# Patient Record
Sex: Female | Born: 1950 | Race: Black or African American | Hispanic: No | Smoking: Current every day smoker
Health system: Southern US, Community
[De-identification: ages and names within clinical notes are randomized; demographics above are authoritative.]

## PROBLEM LIST (undated history)

## (undated) DIAGNOSIS — B009 Herpesviral infection, unspecified: Secondary | ICD-10-CM

## (undated) DIAGNOSIS — Z853 Personal history of malignant neoplasm of breast: Secondary | ICD-10-CM

## (undated) DIAGNOSIS — F1010 Alcohol abuse, uncomplicated: Secondary | ICD-10-CM

## (undated) DIAGNOSIS — I10 Essential (primary) hypertension: Secondary | ICD-10-CM

## (undated) DIAGNOSIS — B181 Chronic viral hepatitis B without delta-agent: Secondary | ICD-10-CM

## (undated) DIAGNOSIS — B1920 Unspecified viral hepatitis C without hepatic coma: Secondary | ICD-10-CM

## (undated) DIAGNOSIS — K8689 Other specified diseases of pancreas: Secondary | ICD-10-CM

## (undated) DIAGNOSIS — C50919 Malignant neoplasm of unspecified site of unspecified female breast: Secondary | ICD-10-CM

## (undated) DIAGNOSIS — D696 Thrombocytopenia, unspecified: Secondary | ICD-10-CM

## (undated) DIAGNOSIS — K219 Gastro-esophageal reflux disease without esophagitis: Secondary | ICD-10-CM

## (undated) DIAGNOSIS — G4700 Insomnia, unspecified: Secondary | ICD-10-CM

## (undated) DIAGNOSIS — R202 Paresthesia of skin: Secondary | ICD-10-CM

## (undated) DIAGNOSIS — K769 Liver disease, unspecified: Secondary | ICD-10-CM

## (undated) HISTORY — DX: Malignant neoplasm of unspecified site of unspecified female breast: C50.919

## (undated) HISTORY — PX: NEPHRECTOMY: SHX65

## (undated) HISTORY — DX: Unspecified viral hepatitis C without hepatic coma: B19.20

## (undated) HISTORY — DX: Gastro-esophageal reflux disease without esophagitis: K21.9

## (undated) HISTORY — DX: Herpesviral infection, unspecified: B00.9

## (undated) HISTORY — DX: Insomnia, unspecified: G47.00

## (undated) HISTORY — DX: Essential (primary) hypertension: I10

## (undated) HISTORY — DX: Personal history of malignant neoplasm of breast: Z85.3

## (undated) HISTORY — DX: Paresthesia of skin: R20.2

## (undated) HISTORY — PX: ABDOMINAL HYSTERECTOMY: SHX81

## (undated) HISTORY — PX: MASTECTOMY: SHX3

## (undated) HISTORY — DX: Other specified diseases of pancreas: K86.89

## (undated) HISTORY — PX: TUBAL LIGATION: SHX77

## (undated) HISTORY — DX: Alcohol abuse, uncomplicated: F10.10

## (undated) HISTORY — DX: Chronic viral hepatitis B without delta-agent: B18.1

## (undated) HISTORY — DX: Liver disease, unspecified: K76.9

## (undated) HISTORY — DX: Thrombocytopenia, unspecified: D69.6

---

## 2007-11-05 ENCOUNTER — Inpatient Hospital Stay: Payer: Self-pay | Admitting: Internal Medicine

## 2008-05-07 ENCOUNTER — Emergency Department: Payer: Self-pay | Admitting: Emergency Medicine

## 2008-06-19 ENCOUNTER — Inpatient Hospital Stay: Payer: Self-pay | Admitting: Psychiatry

## 2008-07-17 ENCOUNTER — Inpatient Hospital Stay: Payer: Self-pay | Admitting: Internal Medicine

## 2009-06-26 ENCOUNTER — Encounter: Payer: Self-pay | Admitting: Cardiovascular Disease

## 2009-06-26 ENCOUNTER — Emergency Department: Payer: Self-pay | Admitting: Emergency Medicine

## 2009-07-08 ENCOUNTER — Emergency Department: Payer: Self-pay | Admitting: Unknown Physician Specialty

## 2009-11-11 ENCOUNTER — Ambulatory Visit: Payer: Self-pay | Admitting: Family Medicine

## 2010-03-26 ENCOUNTER — Ambulatory Visit: Payer: Self-pay | Admitting: Internal Medicine

## 2010-03-26 ENCOUNTER — Ambulatory Visit: Payer: Self-pay | Admitting: Family Medicine

## 2010-03-27 ENCOUNTER — Ambulatory Visit: Payer: Self-pay | Admitting: Internal Medicine

## 2010-04-09 ENCOUNTER — Encounter: Admission: RE | Admit: 2010-04-09 | Discharge: 2010-04-09 | Payer: Self-pay | Admitting: General Surgery

## 2010-04-21 ENCOUNTER — Ambulatory Visit: Payer: Self-pay | Admitting: General Surgery

## 2010-04-23 ENCOUNTER — Encounter: Admission: RE | Admit: 2010-04-23 | Discharge: 2010-04-23 | Payer: Self-pay | Admitting: General Surgery

## 2010-04-25 ENCOUNTER — Ambulatory Visit: Payer: Self-pay | Admitting: Internal Medicine

## 2010-05-04 IMAGING — CT CT HEAD WITHOUT CONTRAST
2 series · 16 of 30 positions shown, 20 images · non-contrast
Comparison: none

REASON FOR EXAM: falling alot/etoh abuse
COMMENTS:

PROCEDURE:     CT  - CT HEAD WITHOUT CONTRAST  - June 18, 2008  [DATE]
RESULT:     Comparison: No comparison
TECHNIQUE: Multiple axial images from the foramen magnum to the vertex were
obtained without IV contrast.

[Series 4: without · axial · non-contrast · 0.42mm/px · z∈[-123,-3]mm · 13 of 30 slices shown, 17 images]
[im 3/30  brain]
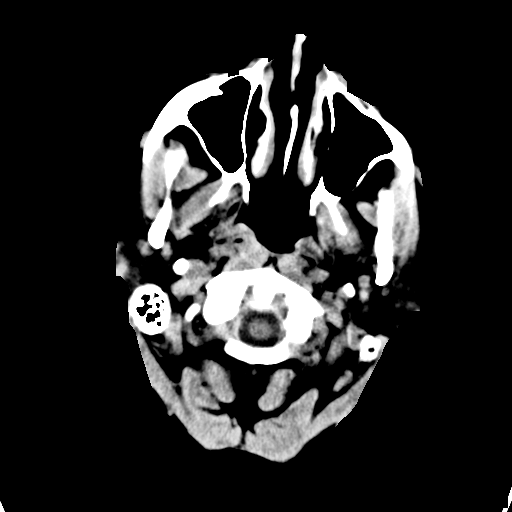
[im 3/30  bone]
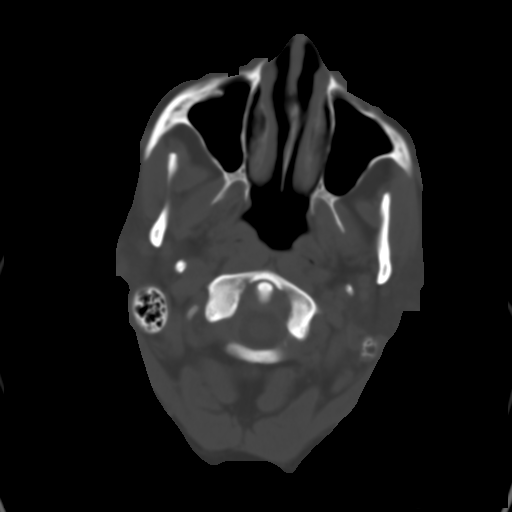
[im 5/30  brain]
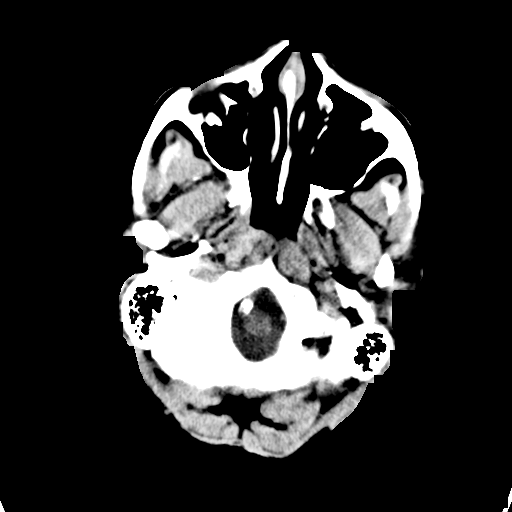
[im 7/30  brain]
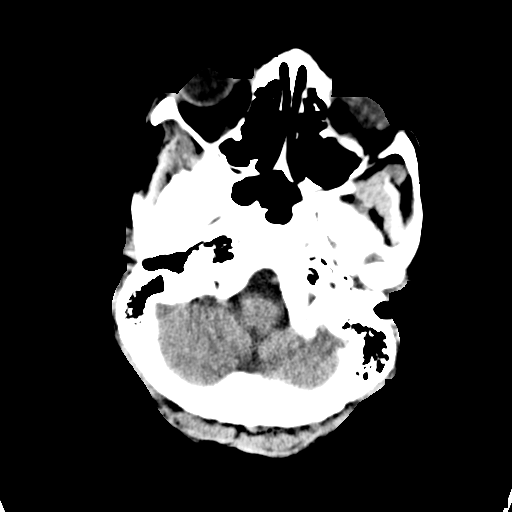
[im 9/30  brain]
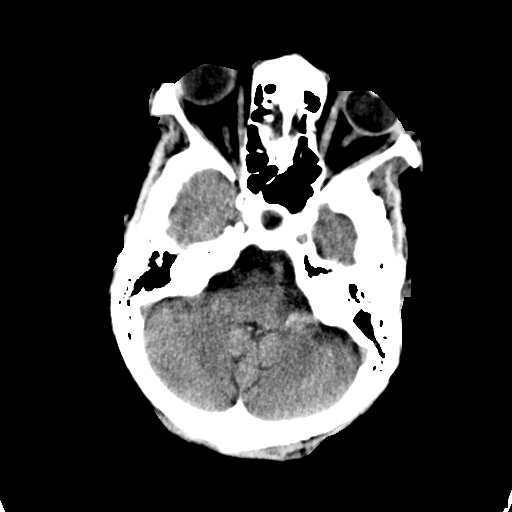
[im 11/30  brain]
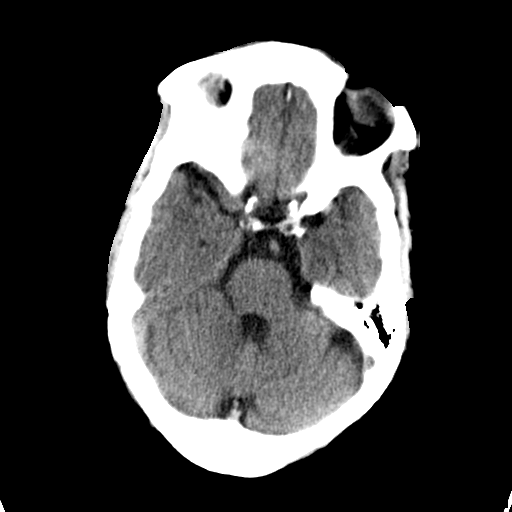
[im 11/30  bone]
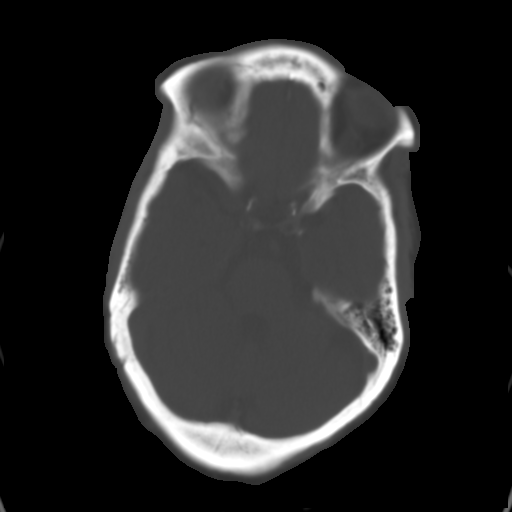
[im 13/30  brain]
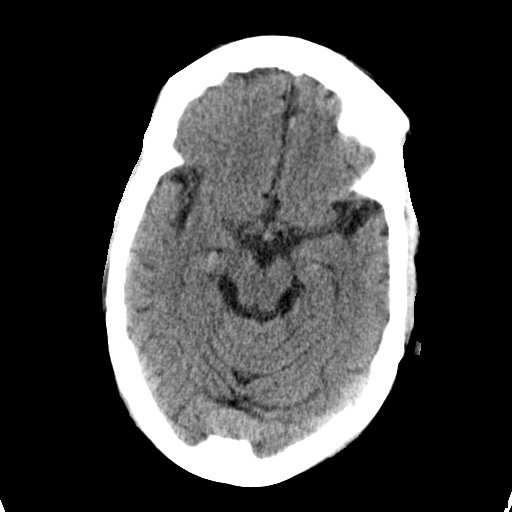
[im 15/30  brain]
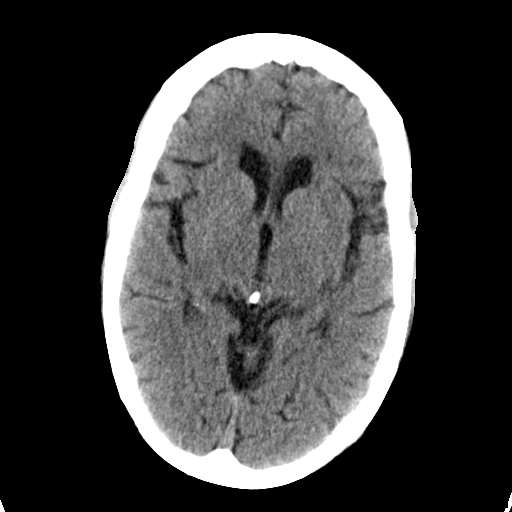
[im 17/30  brain]
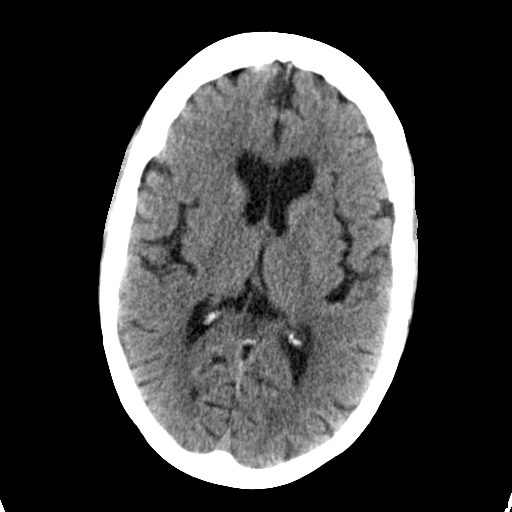
[im 19/30  brain]
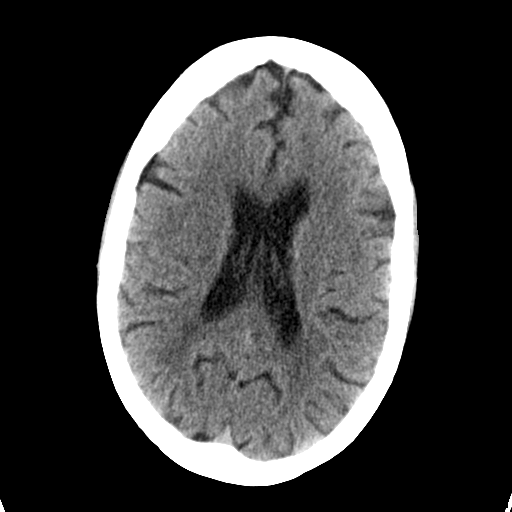
[im 19/30  bone]
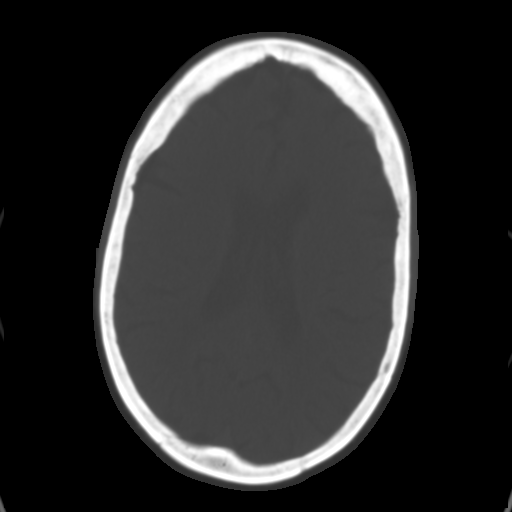
[im 21/30  brain]
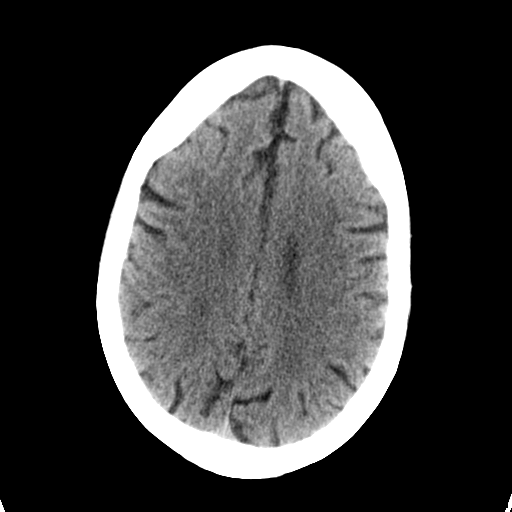
[im 23/30  brain]
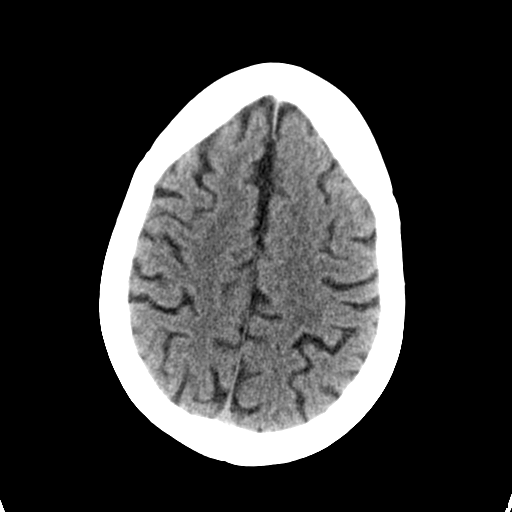
[im 25/30  brain]
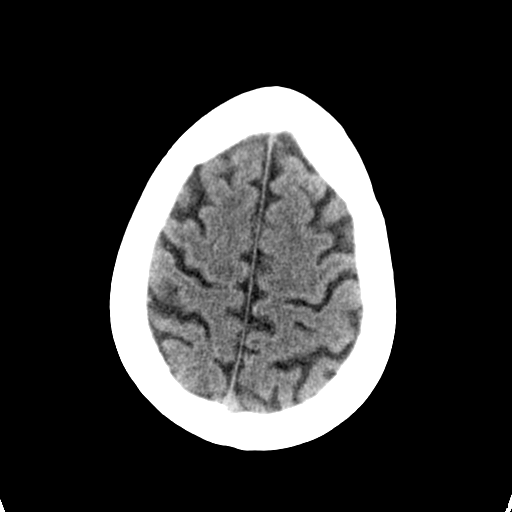
[im 27/30  brain]
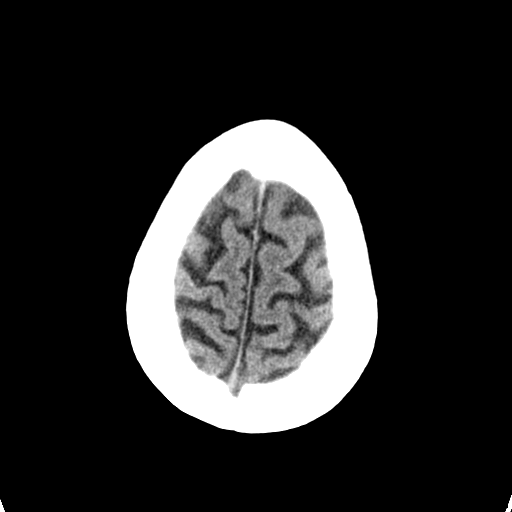
[im 27/30  bone]
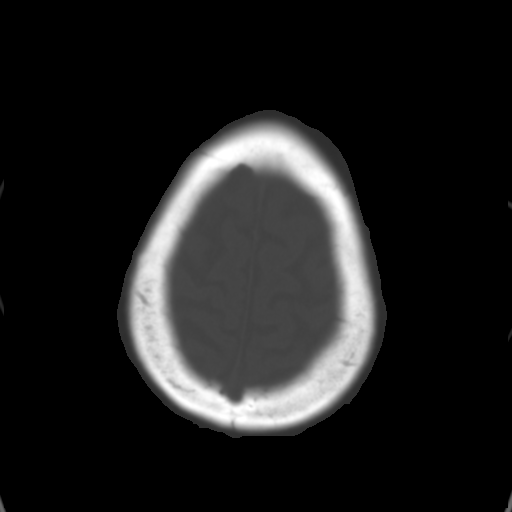

[Series 5: bone · axial · 0.42mm/px · z∈[-123,-83]mm · 3 of 30 slices shown]
[im 3/30  bone]
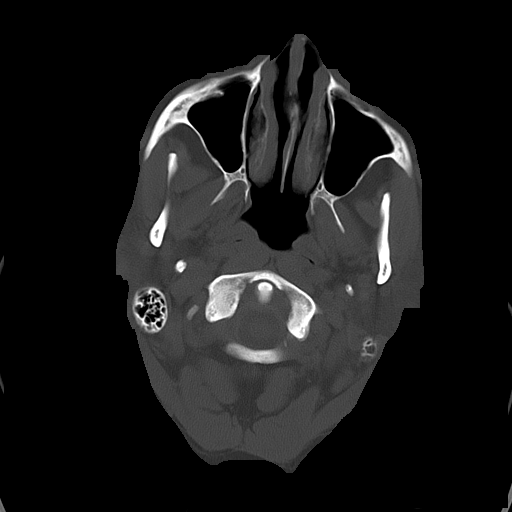
[im 7/30  bone]
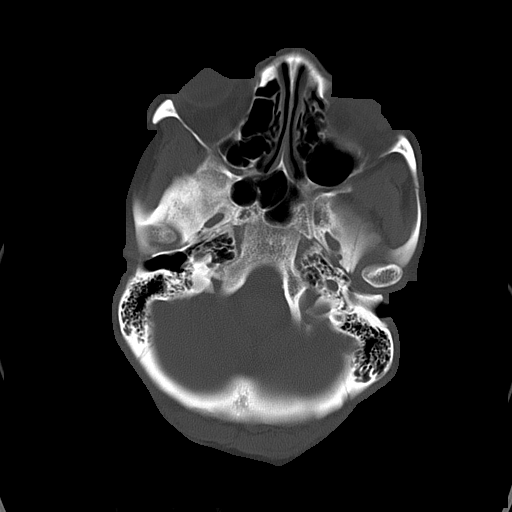
[im 11/30  bone]
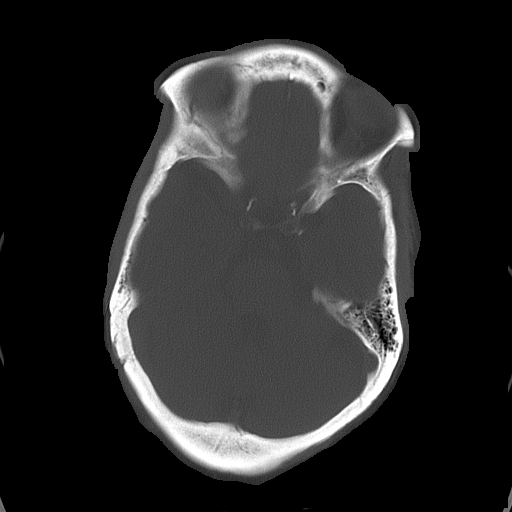

[16 of 30 positions shown; findings below may reference images not displayed]

FINDINGS: There is no evidence for mass effect, midline shift, or extra-axial fluid
collections.  There is no evidence for space-occupying lesion or
intracranial hemorrhage. There is no evidence for cortical-based area of
infarction.

Ventricles and sulci are appropriate for the patient's age. The basal
cisterns are patent.

Visualized portions of the orbits are unremarkable. The paranasal sinuses
and mastoid air cells are unremarkable.

The osseous structures are unremarkable.
IMPRESSION: No acute intracranial process.

## 2010-05-05 ENCOUNTER — Inpatient Hospital Stay: Payer: Self-pay | Admitting: General Surgery

## 2010-05-26 ENCOUNTER — Ambulatory Visit: Payer: Self-pay | Admitting: Internal Medicine

## 2010-06-09 ENCOUNTER — Ambulatory Visit: Payer: Self-pay | Admitting: Internal Medicine

## 2010-06-26 ENCOUNTER — Ambulatory Visit: Payer: Self-pay | Admitting: Internal Medicine

## 2010-07-17 ENCOUNTER — Ambulatory Visit: Payer: Self-pay | Admitting: General Surgery

## 2010-07-26 ENCOUNTER — Ambulatory Visit: Payer: Self-pay | Admitting: Internal Medicine

## 2010-08-22 ENCOUNTER — Inpatient Hospital Stay: Payer: Self-pay | Admitting: Oncology

## 2010-08-26 ENCOUNTER — Ambulatory Visit: Payer: Self-pay | Admitting: Internal Medicine

## 2010-09-25 ENCOUNTER — Ambulatory Visit: Payer: Self-pay | Admitting: Internal Medicine

## 2010-09-30 ENCOUNTER — Ambulatory Visit: Payer: Self-pay | Admitting: General Surgery

## 2010-10-03 ENCOUNTER — Ambulatory Visit: Payer: Self-pay | Admitting: General Surgery

## 2010-10-26 ENCOUNTER — Ambulatory Visit: Payer: Self-pay | Admitting: Internal Medicine

## 2010-11-26 ENCOUNTER — Ambulatory Visit: Payer: Self-pay | Admitting: Internal Medicine

## 2010-12-25 ENCOUNTER — Ambulatory Visit: Payer: Self-pay | Admitting: Internal Medicine

## 2011-01-25 ENCOUNTER — Ambulatory Visit: Payer: Self-pay | Admitting: Internal Medicine

## 2011-02-24 ENCOUNTER — Ambulatory Visit: Payer: Self-pay | Admitting: Internal Medicine

## 2011-03-19 ENCOUNTER — Encounter: Payer: Self-pay | Admitting: Cardiovascular Disease

## 2011-03-27 ENCOUNTER — Ambulatory Visit: Payer: Self-pay | Admitting: Internal Medicine

## 2011-03-30 ENCOUNTER — Encounter: Payer: Self-pay | Admitting: Cardiovascular Disease

## 2011-03-30 ENCOUNTER — Ambulatory Visit (INDEPENDENT_AMBULATORY_CARE_PROVIDER_SITE_OTHER): Payer: Medicaid Other | Admitting: Cardiovascular Disease

## 2011-03-30 VITALS — BP 126/72 | HR 61 | Ht 64.0 in | Wt 186.8 lb

## 2011-03-30 DIAGNOSIS — R9431 Abnormal electrocardiogram [ECG] [EKG]: Secondary | ICD-10-CM

## 2011-03-30 DIAGNOSIS — K759 Inflammatory liver disease, unspecified: Secondary | ICD-10-CM | POA: Insufficient documentation

## 2011-03-30 DIAGNOSIS — C50919 Malignant neoplasm of unspecified site of unspecified female breast: Secondary | ICD-10-CM | POA: Insufficient documentation

## 2011-03-30 DIAGNOSIS — R002 Palpitations: Secondary | ICD-10-CM

## 2011-03-30 DIAGNOSIS — E119 Type 2 diabetes mellitus without complications: Secondary | ICD-10-CM | POA: Insufficient documentation

## 2011-03-30 NOTE — Progress Notes (Signed)
   Patient ID: [name], female    DOB: [birth year], 60 y.o.   MRN: [id number]  HPI Comments: Ms. Kara Flowers is a very pleasant 60 year old woman, patient of Dr. Marvis Moeller and Dr. Lorre Nick who presents by referral for arrhythmia during her chemotherapy treatment.  She was recently seen by Dr. Lorre Nick and was found to have frequent APCs. As there can be some cardiac issues while on Herceptin, her last treatment was held pending evaluation today. She reports that she feels well, has no shortness of breath, denies any tachycardia palpitations. Remotely, when she was on "dope" she had some heart arrhythmias but that was many years ago and she does not take drugs now. She otherwise feels well and has no complaints.   Today in the office, she reports that her sugar level is low and we have given her a candy to help with her sugars.  Notes indicate that she had a diagnosis of breast cancer, mastectomy, treatment with chemotherapy including Cytoxan, Adriamycin, Taxol and Herceptin.  EKG today shows normal sinus rhythm with rate 61 beats per minute with no significant ST or T wave changes     Review of Systems  Constitutional: Negative.        Feels a little bit weak and dizzy from low sugar level.  HENT: Negative.   Eyes: Negative.   Respiratory: Negative.   Cardiovascular: Negative.   Gastrointestinal: Negative.   Musculoskeletal: Negative.   Skin: Negative.   Neurological: Negative.   Hematological: Negative.   Psychiatric/Behavioral: Negative.   All other systems reviewed and are negative.     BP 126/72  Pulse 61  Ht 5\' 4"  (1.626 m)  Wt 186 lb 12.8 oz (84.732 kg)  BMI 32.06 kg/m2  Physical Exam  Nursing note and vitals reviewed. Constitutional: She is oriented to person, place, and time. She appears well-developed and well-nourished.  HENT:  Head: Normocephalic.  Nose: Nose normal.  Mouth/Throat: Oropharynx is clear and moist.  Eyes: Conjunctivae are normal. Pupils are equal,  round, and reactive to light.  Neck: Normal range of motion. Neck supple. No JVD present.  Cardiovascular: Normal rate, regular rhythm, S1 normal, S2 normal, normal heart sounds and intact distal pulses.  Exam reveals no gallop and no friction rub.   No murmur heard. Pulmonary/Chest: Effort normal and breath sounds normal. No respiratory distress. She has no wheezes. She has no rales. She exhibits no tenderness.  Abdominal: Soft. Bowel sounds are normal. She exhibits no distension. There is no tenderness.  Musculoskeletal: Normal range of motion. She exhibits no edema and no tenderness.  Lymphadenopathy:    She has no cervical adenopathy.  Neurological: She is alert and oriented to person, place, and time. Coordination normal.  Skin: Skin is warm and dry. No rash noted. No erythema.  Psychiatric: She has a normal mood and affect. Her behavior is normal. Judgment and thought content normal.         Assessment and Plan

## 2011-03-30 NOTE — Patient Instructions (Signed)
You are doing well. No medication changes were made. Please call us if you have new issues that need to be addressed    

## 2011-03-30 NOTE — Assessment & Plan Note (Signed)
She currently feels well, EKG is normal, no clinical signs of heart failure today or in the recent past. She has reasonable exercise tolerance. No further workup has been ordered at this time. Per the notes, MUGA scans in the past have been essentially normal. We would recommend that she restart her chemotherapy treatment as she was previously taking with periodic monitoring by MUGA scan. APCs likely benign and not likely complicate her treatment program.

## 2011-03-30 NOTE — Assessment & Plan Note (Signed)
We have suggested she try to maintain her treatment program scheduled to restart this Thursday with Herceptin.

## 2011-03-30 NOTE — Assessment & Plan Note (Signed)
We have encouraged continued exercise, careful diet management in an effort to lose weight. 

## 2011-04-07 ENCOUNTER — Ambulatory Visit: Payer: Self-pay | Admitting: General Surgery

## 2011-04-22 ENCOUNTER — Encounter: Payer: Self-pay | Admitting: Cardiovascular Disease

## 2011-04-26 ENCOUNTER — Ambulatory Visit: Payer: Self-pay | Admitting: Internal Medicine

## 2011-05-27 ENCOUNTER — Ambulatory Visit: Payer: Self-pay | Admitting: Internal Medicine

## 2011-06-12 ENCOUNTER — Observation Stay: Payer: Self-pay | Admitting: Internal Medicine

## 2011-06-12 ENCOUNTER — Telehealth: Payer: Self-pay | Admitting: *Deleted

## 2011-06-12 DIAGNOSIS — I471 Supraventricular tachycardia: Secondary | ICD-10-CM

## 2011-06-12 NOTE — Telephone Encounter (Signed)
Called pt to follow up with her symptoms of CP. Dr. Mariah Milling received call yesterday from Dr. Onnie Boer office, pt seen and presented with CP. She had normal cardiac enzymes. She is not scheduled for a f/u here as of now, will see if pt needs appt, and will advise her if recurrent symptoms of CP, to call us and not Dr. Neale Burly. Attempted to contact pt, LMOM TCB.   During typing this msg, we received consult for this pt for SVT. Will notify Dr. Mariah Milling.

## 2011-06-14 NOTE — Telephone Encounter (Signed)
She needs follow up in the clinic...maybe friday

## 2011-06-15 NOTE — Telephone Encounter (Signed)
Kara Flowers, can you please schedule pt this Fri with TG, thanks.

## 2011-06-19 ENCOUNTER — Encounter: Payer: Self-pay | Admitting: Cardiovascular Disease

## 2011-06-19 ENCOUNTER — Ambulatory Visit (INDEPENDENT_AMBULATORY_CARE_PROVIDER_SITE_OTHER): Payer: Medicaid Other | Admitting: Cardiovascular Disease

## 2011-06-19 ENCOUNTER — Telehealth: Payer: Self-pay

## 2011-06-19 VITALS — BP 138/83 | HR 73 | Ht 64.0 in | Wt 189.0 lb

## 2011-06-19 DIAGNOSIS — E119 Type 2 diabetes mellitus without complications: Secondary | ICD-10-CM

## 2011-06-19 DIAGNOSIS — I471 Supraventricular tachycardia: Secondary | ICD-10-CM | POA: Insufficient documentation

## 2011-06-19 DIAGNOSIS — C50919 Malignant neoplasm of unspecified site of unspecified female breast: Secondary | ICD-10-CM

## 2011-06-19 DIAGNOSIS — I498 Other specified cardiac arrhythmias: Secondary | ICD-10-CM

## 2011-06-19 NOTE — Assessment & Plan Note (Addendum)
In an effort to control her SVT, we have suggested starting diltiazem 120 mg daily. As her blood pressure is adequate at this time, we will decrease her metoprolol tartrate to 25 mg b.i.d. From 50 mg to avoid hypotension. I suggested that she take the diltiazem 30 mg p.r.n. For tachycardia or metoprolol 25 mg.  We have scheduled her for an echocardiogram given her recent runs of SVT to rule out underlying structural disease.

## 2011-06-19 NOTE — Progress Notes (Signed)
Patient ID: Kara Flowers, female    DOB: 1951/04/06, 60 y.o.   MRN: [id number]  HPI Comments: Ms. Kara Flowers is a very pleasant 60 year old woman, patient of Dr. Marvis Flowers and Dr. Lorre Flowers who Presents for routine followup after recent evaluation in the hospital. She continues to smoke one half to one pack per day. She has a history of breast cancer, mastectomy, has been on chemotherapy with treatments scheduled to September of this year, previous history of APCs, presenting with tachycardia to [facility] . Last week she was found to have SVT with rates in the 170 range. Adenosine was given with conversion to normal sinus rhythm. She had additional episodes of SVT again the following night in the hospital. She did have some chest tightness which was mild with these fast rhythms. She was continued on metoprolol b.i.d. And discharged with followup today.Kara Flowers  She was seen by Dr. Lorre Flowers yesterday and treatment for breast cancer was reinitiated which I felt was appropriate.  She reports that she has not received her diltiazem 120 mg daily. He has not been delivered. She has continued on metoprolol 50 mg b.i.d. She denies any further tachycardia palpitations or episodes of chest tightness.  EKG today shows normal sinus rhythm with rate 69 beats per minute with no significant ST or T wave changes, Possible left atrial enlargement Total cholesterol 129, HDL 37, LDL 55   Outpatient Encounter Prescriptions as of 06/19/2011  Medication Sig Dispense Refill  . B Complex Vitamins (VITAMIN B COMPLEX PO) Take 1 capsule by mouth daily.        Kara Flowers diltiazem (CARDIZEM) 30 MG tablet Take 30 mg by mouth 4 (four) times daily. As needed       . diphenhydrAMINE (SOMINEX) 25 MG tablet Take 25 mg by mouth at bedtime as needed.        . docusate sodium (COLACE) 100 MG capsule Take 100 mg by mouth 2 (two) times daily.        Kara Flowers GABAPENTIN, PHN, PO Take by mouth at bedtime.        . hyoscyamine (LEVSIN, ANASPAZ) 0.125 MG tablet Take 0.125  mg by mouth every 4 (four) hours as needed.        Kara Flowers ibuprofen (ADVIL,MOTRIN) 200 MG tablet Take 200 mg by mouth every 6 (six) hours as needed.        . insulin glargine (LANTUS) 100 UNIT/ML injection Inject 5 Units into the skin at bedtime.        . magnesium oxide (MAG-OX) 400 MG tablet Take 400 mg by mouth daily.        . metFORMIN (GLUMETZA) 1000 MG (MOD) 24 hr tablet Take 1,000 mg by mouth 2 (two) times daily with a meal.        . metoprolol (LOPRESSOR) 50 MG tablet Take 50 mg by mouth 2 (two) times daily.        Kara Flowers omeprazole (PRILOSEC) 20 MG capsule Take 20 mg by mouth daily.        . Pancrelipase, Lip-Prot-Amyl, (CREON PO) Take by mouth.        Kara Flowers rOPINIRole (REQUIP) 0.5 MG tablet Take 0.5 mg by mouth at bedtime.        .  traZODone (DESYREL) 50 MG tablet Take 50 mg by mouth at bedtime.           Review of Systems  Constitutional: Positive for fatigue.       Feels a little bit weak and dizzy from low sugar  level.  HENT: Negative.   Eyes: Negative.   Respiratory: Negative.   Cardiovascular: Negative.   Gastrointestinal: Negative.   Musculoskeletal: Negative.   Skin: Negative.   Neurological: Positive for weakness.  Hematological: Negative.   Psychiatric/Behavioral: Negative.   All other systems reviewed and are negative.     BP 138/83  Pulse 73  Ht 5\' 4"  (1.626 m)  Wt 189 lb (85.73 kg)  BMI 32.44 kg/m2  Physical Exam  Nursing note and vitals reviewed. Constitutional: She is oriented to person, place, and time. She appears well-developed and well-nourished.  HENT:  Head: Normocephalic.  Nose: Nose normal.  Mouth/Throat: Oropharynx is clear and moist.  Eyes: Conjunctivae are normal. Pupils are equal, round, and reactive to light.  Neck: Normal range of motion. Neck supple. No JVD present.  Cardiovascular: Normal rate, regular rhythm, S1 normal, S2 normal, normal heart sounds and intact distal pulses.  Exam reveals no gallop and no friction rub.   No murmur  heard. Pulmonary/Chest: Effort normal and breath sounds normal. No respiratory distress. She has no wheezes. She has no rales. She exhibits no tenderness.  Abdominal: Soft. Bowel sounds are normal. She exhibits no distension. There is no tenderness.  Musculoskeletal: Normal range of motion. She exhibits no edema and no tenderness.  Lymphadenopathy:    She has no cervical adenopathy.  Neurological: She is alert and oriented to person, place, and time. Coordination normal.  Skin: Skin is warm and dry. No rash noted. No erythema.  Psychiatric: She has a normal mood and affect. Her behavior is normal. Judgment and thought content normal.         Assessment and Plan

## 2011-06-19 NOTE — Assessment & Plan Note (Signed)
We have encouraged continued exercise, careful diet management in an effort to lose weight. 

## 2011-06-19 NOTE — Assessment & Plan Note (Signed)
I have recommended that she continue with her treatment for breast cancer despite her arrhythmia. We will try to manage her arrhythmia as it presents. No further cardiac workup at this time as needed.

## 2011-06-19 NOTE — Telephone Encounter (Signed)
Notified patient okay per Dr. Mariah Milling to take Trazadone.

## 2011-06-19 NOTE — Telephone Encounter (Signed)
Patient needs to know ASAP if okay to take the Trazadone?

## 2011-06-19 NOTE — Patient Instructions (Signed)
Start the new medication, diltiazem one a day When you start the medication, decrease the metoprolol to a 1/2 pill in the Am and PM (twice a day) Please call us if you have new issues that need to be addressed before your next appt.  We will call you for a follow up Appt. In 6 months  We will schedule you for an echocardiogram to look at the heart function (you have had chemotherapy, and recent SVT)

## 2011-06-26 ENCOUNTER — Other Ambulatory Visit (INDEPENDENT_AMBULATORY_CARE_PROVIDER_SITE_OTHER): Payer: Medicaid Other | Admitting: *Deleted

## 2011-06-26 DIAGNOSIS — I428 Other cardiomyopathies: Secondary | ICD-10-CM

## 2011-06-26 DIAGNOSIS — I471 Supraventricular tachycardia: Secondary | ICD-10-CM

## 2011-06-27 ENCOUNTER — Ambulatory Visit: Payer: Self-pay | Admitting: Internal Medicine

## 2011-07-01 ENCOUNTER — Encounter (HOSPITAL_COMMUNITY): Payer: Self-pay | Admitting: Cardiovascular Disease

## 2011-07-08 ENCOUNTER — Encounter: Payer: Self-pay | Admitting: Cardiovascular Disease

## 2011-07-09 ENCOUNTER — Encounter: Payer: Self-pay | Admitting: Cardiovascular Disease

## 2011-07-27 ENCOUNTER — Ambulatory Visit: Payer: Self-pay | Admitting: Internal Medicine

## 2011-07-30 ENCOUNTER — Telehealth: Payer: Self-pay

## 2011-07-30 MED ORDER — DILTIAZEM HCL 30 MG PO TABS: 30.0000 mg | ORAL_TABLET | Freq: Four times a day (QID) | ORAL | Status: AC

## 2011-07-30 MED ORDER — DILTIAZEM HCL 30 MG PO TABS: 30.0000 mg | ORAL_TABLET | Freq: Four times a day (QID) | ORAL | Status: DC

## 2011-07-30 NOTE — Telephone Encounter (Signed)
Refill sent for diltiazem 30 mg. [facility].

## 2011-08-14 ENCOUNTER — Telehealth: Payer: Self-pay | Admitting: *Deleted

## 2011-08-14 NOTE — Telephone Encounter (Signed)
Pt called to clarify medications. She wanted to make sure she was only to take cardizem prn, and does Dr. Mariah Milling want her to take metoprolol also? Pt states she has not taken metoprolol in a long time but was told just to keep on hand if needed. Also asks if he wants her on lisinopril for BP? Per last rx refill and note, I told pt she is to take cardizem only PRN, and did not see where she was to take any other med for BP. BP was not elevated at last visit, she does not check at home. Pt will f/u with Dr. Marcello Fennel next week, and she just wanted to make sure she had meds correct. I told pt I will take metoprolol off her list since she states she does not take, will verify with TG that she is not to be on anything different. Otherwise, she will f/u as scheduled.

## 2011-08-18 NOTE — Telephone Encounter (Signed)
My note details it all:  metoprolol tartrate 25 bid with diltiazem CD 120 daily, Diltiazem 30 prn for SVT breakthrough thx

## 2011-08-24 NOTE — Telephone Encounter (Signed)
There was some confusion with pt's medications and what she was given at hospital discharge. She states she has never taken cardizem 120 and it is not on her medlist even in history meds. Pt has been scheduled for RN visit to review meds, she was instructed to bring all med bottles. Per TG we will make sure she is taking cardizem 120mg  and remaining med/doses as stated below.

## 2011-08-25 ENCOUNTER — Ambulatory Visit: Payer: Medicaid Other | Admitting: *Deleted

## 2011-08-27 ENCOUNTER — Ambulatory Visit: Payer: Self-pay | Admitting: Internal Medicine

## 2011-09-14 ENCOUNTER — Ambulatory Visit: Payer: Self-pay | Admitting: Internal Medicine

## 2011-09-22 ENCOUNTER — Inpatient Hospital Stay: Payer: Self-pay | Admitting: Internal Medicine

## 2011-09-26 ENCOUNTER — Ambulatory Visit: Payer: Self-pay | Admitting: Internal Medicine

## 2011-10-01 ENCOUNTER — Inpatient Hospital Stay: Payer: Self-pay | Admitting: Internal Medicine

## 2011-10-02 LAB — CANCER ANTIGEN 27.29: CA 27.29: 60.2 U/mL — ABNORMAL HIGH (ref 0.0–38.6)

## 2011-10-06 ENCOUNTER — Ambulatory Visit: Payer: Self-pay | Admitting: Internal Medicine

## 2011-10-27 ENCOUNTER — Ambulatory Visit: Payer: Self-pay | Admitting: Internal Medicine

## 2011-10-28 ENCOUNTER — Ambulatory Visit: Payer: Self-pay | Admitting: Internal Medicine

## 2011-11-02 LAB — CBC CANCER CENTER
Basophil #: 0 x10 3/mm (ref 0.0–0.1)
Eosinophil #: 0 x10 3/mm (ref 0.0–0.7)
Eosinophil %: 0.3 %
HCT: 36.8 % (ref 35.0–47.0)
HGB: 12.3 g/dL (ref 12.0–16.0)
Lymphocyte #: 0.6 x10 3/mm — ABNORMAL LOW (ref 1.0–3.6)
Lymphocytes: 8 %
MCV: 84 fL (ref 80–100)
Monocyte #: 0.1 x10 3/mm (ref 0.0–0.7)
Monocyte %: 2.4 %
Neutrophil #: 4.3 x10 3/mm (ref 1.4–6.5)
RBC: 4.4 10*6/uL (ref 3.80–5.20)
RDW: 20 % — ABNORMAL HIGH (ref 11.5–14.5)
Segmented Neutrophils: 90 %
WBC: 5 x10 3/mm (ref 3.6–11.0)

## 2011-11-02 LAB — COMPREHENSIVE METABOLIC PANEL
Albumin: 2 g/dL — ABNORMAL LOW (ref 3.4–5.0)
Alkaline Phosphatase: 135 U/L (ref 50–136)
Anion Gap: 6 — ABNORMAL LOW (ref 7–16)
BUN: 27 mg/dL — ABNORMAL HIGH (ref 7–18)
Calcium, Total: 8.1 mg/dL — ABNORMAL LOW (ref 8.5–10.1)
Glucose: 328 mg/dL — ABNORMAL HIGH (ref 65–99)
Osmolality: 288 (ref 275–301)
Potassium: 4 mmol/L (ref 3.5–5.1)
SGOT(AST): 104 U/L — ABNORMAL HIGH (ref 15–37)
SGPT (ALT): 192 U/L — ABNORMAL HIGH
Total Protein: 5.3 g/dL — ABNORMAL LOW (ref 6.4–8.2)

## 2011-11-09 LAB — CBC CANCER CENTER
Basophil %: 0.9 %
Eosinophil %: 0 %
HCT: 36 % (ref 35.0–47.0)
HGB: 12 g/dL (ref 12.0–16.0)
Lymphocyte %: 17.5 %
MCH: 28 pg (ref 26.0–34.0)
MCHC: 33.2 g/dL (ref 32.0–36.0)
MCV: 84 fL (ref 80–100)
Neutrophil #: 3.5 x10 3/mm (ref 1.4–6.5)
Neutrophil %: 78.9 %

## 2011-11-09 LAB — CREATININE, SERUM
Creatinine: 1.27 mg/dL (ref 0.60–1.30)
EGFR (African American): 55 — ABNORMAL LOW
EGFR (Non-African Amer.): 46 — ABNORMAL LOW

## 2011-11-09 LAB — HEPATIC FUNCTION PANEL A (ARMC)
Alkaline Phosphatase: 156 U/L — ABNORMAL HIGH (ref 50–136)
Bilirubin,Total: 0.3 mg/dL (ref 0.2–1.0)
SGPT (ALT): 190 U/L — ABNORMAL HIGH

## 2011-11-11 ENCOUNTER — Inpatient Hospital Stay: Payer: Self-pay | Admitting: Internal Medicine

## 2011-11-11 LAB — URINALYSIS, COMPLETE
Bacteria: NONE SEEN
Bilirubin,UR: NEGATIVE
Ketone: NEGATIVE
Ph: 5 (ref 4.5–8.0)
Specific Gravity: 1.017 (ref 1.003–1.030)
Squamous Epithelial: NONE SEEN

## 2011-11-11 LAB — CBC
HCT: 35.9 % (ref 35.0–47.0)
HGB: 11.8 g/dL — ABNORMAL LOW (ref 12.0–16.0)
RBC: 4.25 10*6/uL (ref 3.80–5.20)

## 2011-11-11 LAB — DRUG SCREEN, URINE
Amphetamines, Ur Screen: NEGATIVE (ref ?–1000)
Barbiturates, Ur Screen: NEGATIVE (ref ?–200)
Benzodiazepine, Ur Scrn: NEGATIVE (ref ?–200)
Cannabinoid 50 Ng, Ur ~~LOC~~: NEGATIVE (ref ?–50)
Cocaine Metabolite,Ur ~~LOC~~: NEGATIVE (ref ?–300)
Methadone, Ur Screen: NEGATIVE (ref ?–300)
Opiate, Ur Screen: NEGATIVE (ref ?–300)
Tricyclic, Ur Screen: NEGATIVE (ref ?–1000)

## 2011-11-11 LAB — CK TOTAL AND CKMB (NOT AT ARMC)
CK, Total: 264 U/L — ABNORMAL HIGH (ref 21–215)
CK, Total: 245 U/L — ABNORMAL HIGH (ref 21–215)
CK-MB: 3.8 ng/mL — ABNORMAL HIGH (ref 0.5–3.6)
CK-MB: 3.5 ng/mL (ref 0.5–3.6)

## 2011-11-11 LAB — TROPONIN I
Troponin-I: 0.07 ng/mL — ABNORMAL HIGH
Troponin-I: 0.04 ng/mL
Troponin-I: 0.04 ng/mL

## 2011-11-11 LAB — PRO B NATRIURETIC PEPTIDE: B-Type Natriuretic Peptide: 817 pg/mL — ABNORMAL HIGH (ref 0–125)

## 2011-11-11 LAB — COMPREHENSIVE METABOLIC PANEL
Albumin: 1.8 g/dL — ABNORMAL LOW (ref 3.4–5.0)
Alkaline Phosphatase: 129 U/L (ref 50–136)
BUN: 31 mg/dL — ABNORMAL HIGH (ref 7–18)
Bilirubin,Total: 0.5 mg/dL (ref 0.2–1.0)
Creatinine: 1.03 mg/dL (ref 0.60–1.30)
Glucose: 149 mg/dL — ABNORMAL HIGH (ref 65–99)
Osmolality: 291 (ref 275–301)
SGPT (ALT): 176 U/L — ABNORMAL HIGH
Sodium: 141 mmol/L (ref 136–145)

## 2011-11-11 LAB — LIPASE, BLOOD: Lipase: 1369 U/L — ABNORMAL HIGH (ref 73–393)

## 2011-11-12 LAB — BASIC METABOLIC PANEL
Anion Gap: 9 (ref 7–16)
Calcium, Total: 8.1 mg/dL — ABNORMAL LOW (ref 8.5–10.1)
Chloride: 100 mmol/L (ref 98–107)
Co2: 27 mmol/L (ref 21–32)
Creatinine: 0.94 mg/dL (ref 0.60–1.30)
EGFR (African American): >60
Osmolality: 271 (ref 275–301)

## 2011-11-12 LAB — CBC WITH DIFFERENTIAL/PLATELET
Basophil %: 0.6 %
Eosinophil #: 0 10*3/uL (ref 0.0–0.7)
HGB: 11.7 g/dL — ABNORMAL LOW (ref 12.0–16.0)
Lymphocyte #: 1.5 10*3/uL (ref 1.0–3.6)
MCH: 27.7 pg (ref 26.0–34.0)
MCHC: 32.6 g/dL (ref 32.0–36.0)
Monocyte #: 0.2 10*3/uL (ref 0.0–0.7)
Neutrophil %: 54.8 %
Platelet: 40 10*3/uL — ABNORMAL LOW (ref 150–440)

## 2011-11-12 LAB — URINALYSIS, COMPLETE
Bilirubin,UR: NEGATIVE
Ketone: NEGATIVE
Ph: 6 (ref 4.5–8.0)
Protein: 100
Squamous Epithelial: NONE SEEN

## 2011-11-12 LAB — LIPID PANEL
HDL Cholesterol: 90 mg/dL — ABNORMAL HIGH (ref 40–60)
VLDL Cholesterol, Calc: 64 mg/dL — ABNORMAL HIGH (ref 5–40)

## 2011-11-12 LAB — AMYLASE: Amylase: 253 U/L — ABNORMAL HIGH (ref 25–115)

## 2011-11-12 LAB — HEMOGLOBIN A1C: Hemoglobin A1C: 11.2 % — ABNORMAL HIGH (ref 4.2–6.3)

## 2011-11-12 LAB — LIPASE, BLOOD: Lipase: 383 U/L (ref 73–393)

## 2011-11-13 LAB — CBC WITH DIFFERENTIAL/PLATELET
Basophil #: 0 10*3/uL (ref 0.0–0.1)
Basophil %: 0.6 %
Eosinophil %: 0.3 %
Lymphocyte #: 2.4 10*3/uL (ref 1.0–3.6)
Lymphocyte %: 52.1 %
MCH: 27.9 pg (ref 26.0–34.0)
MCV: 86 fL (ref 80–100)
Monocyte #: 0.2 10*3/uL (ref 0.0–0.7)
Monocyte %: 3.9 %
Neutrophil %: 43.1 %
Platelet: 45 10*3/uL — ABNORMAL LOW (ref 150–440)
RBC: 4.47 10*6/uL (ref 3.80–5.20)
RDW: 20.1 % — ABNORMAL HIGH (ref 11.5–14.5)
WBC: 4.7 10*3/uL (ref 3.6–11.0)

## 2011-11-13 LAB — COMPREHENSIVE METABOLIC PANEL
Albumin: 1.5 g/dL — ABNORMAL LOW (ref 3.4–5.0)
Alkaline Phosphatase: 135 U/L (ref 50–136)
Anion Gap: 10 (ref 7–16)
Calcium, Total: 7.8 mg/dL — ABNORMAL LOW (ref 8.5–10.1)
Co2: 29 mmol/L (ref 21–32)
EGFR (African American): >60
EGFR (Non-African Amer.): >60
Osmolality: 275 (ref 275–301)
Potassium: 3.5 mmol/L (ref 3.5–5.1)
SGOT(AST): 119 U/L — ABNORMAL HIGH (ref 15–37)
Sodium: 139 mmol/L (ref 136–145)

## 2011-11-17 LAB — CULTURE, BLOOD (SINGLE)

## 2011-11-24 ENCOUNTER — Emergency Department: Payer: Self-pay | Admitting: Unknown Physician Specialty

## 2011-11-27 ENCOUNTER — Ambulatory Visit: Payer: Self-pay | Admitting: Internal Medicine

## 2011-12-17 ENCOUNTER — Emergency Department: Payer: Self-pay | Admitting: Emergency Medicine

## 2012-02-02 ENCOUNTER — Ambulatory Visit: Payer: Self-pay | Admitting: Internal Medicine

## 2012-02-14 ENCOUNTER — Emergency Department: Payer: Self-pay | Admitting: Emergency Medicine

## 2012-02-14 LAB — COMPREHENSIVE METABOLIC PANEL
BUN: 9 mg/dL (ref 7–18)
Calcium, Total: 9.1 mg/dL (ref 8.5–10.1)
Chloride: 107 mmol/L (ref 98–107)
Osmolality: 274 (ref 275–301)
Potassium: 4 mmol/L (ref 3.5–5.1)
SGOT(AST): 39 U/L — ABNORMAL HIGH (ref 15–37)
SGPT (ALT): 25 U/L
Sodium: 138 mmol/L (ref 136–145)
Total Protein: 7.1 g/dL (ref 6.4–8.2)

## 2012-02-14 LAB — PROTIME-INR
INR: 0.9
Prothrombin Time: 13 secs (ref 11.5–14.7)

## 2012-02-14 LAB — CBC
MCH: 27.4 pg (ref 26.0–34.0)
MCV: 87 fL (ref 80–100)
RBC: 4.61 10*6/uL (ref 3.80–5.20)
RDW: 14.8 % — ABNORMAL HIGH (ref 11.5–14.5)
WBC: 4.1 10*3/uL (ref 3.6–11.0)

## 2012-02-24 ENCOUNTER — Ambulatory Visit: Payer: Self-pay | Admitting: Internal Medicine

## 2012-03-26 ENCOUNTER — Ambulatory Visit: Payer: Self-pay | Admitting: Internal Medicine

## 2012-04-20 ENCOUNTER — Emergency Department: Payer: Self-pay

## 2012-04-27 ENCOUNTER — Ambulatory Visit: Payer: Self-pay | Admitting: Internal Medicine

## 2012-05-03 ENCOUNTER — Ambulatory Visit: Payer: Self-pay | Admitting: Internal Medicine

## 2012-06-08 ENCOUNTER — Ambulatory Visit: Payer: Self-pay | Admitting: Internal Medicine

## 2012-06-26 ENCOUNTER — Ambulatory Visit: Payer: Self-pay | Admitting: Internal Medicine

## 2012-06-27 ENCOUNTER — Inpatient Hospital Stay: Payer: Self-pay | Admitting: Internal Medicine

## 2012-06-27 LAB — COMPREHENSIVE METABOLIC PANEL
Albumin: 3.3 g/dL — ABNORMAL LOW (ref 3.4–5.0)
Alkaline Phosphatase: 99 U/L (ref 50–136)
BUN: 18 mg/dL (ref 7–18)
Bilirubin,Total: 0.7 mg/dL (ref 0.2–1.0)
Creatinine: 1.07 mg/dL (ref 0.60–1.30)
EGFR (Non-African Amer.): 56 — ABNORMAL LOW
Glucose: 104 mg/dL — ABNORMAL HIGH (ref 65–99)
SGPT (ALT): 37 U/L (ref 12–78)
Sodium: 138 mmol/L (ref 136–145)
Total Protein: 7.3 g/dL (ref 6.4–8.2)

## 2012-06-27 LAB — TROPONIN I: Troponin-I: 0.25 ng/mL — ABNORMAL HIGH

## 2012-06-27 LAB — URINALYSIS, COMPLETE
Bacteria: NONE SEEN
Blood: NEGATIVE
Glucose,UR: NEGATIVE mg/dL (ref 0–75)
Leukocyte Esterase: NEGATIVE
Nitrite: NEGATIVE
Ph: 5 (ref 4.5–8.0)
Protein: 30

## 2012-06-27 LAB — CBC
HCT: 40.1 % (ref 35.0–47.0)
HGB: 13.3 g/dL (ref 12.0–16.0)
WBC: 7.7 10*3/uL (ref 3.6–11.0)

## 2012-06-27 LAB — CK TOTAL AND CKMB (NOT AT ARMC): CK-MB: 3.8 ng/mL — ABNORMAL HIGH (ref 0.5–3.6)

## 2012-06-28 LAB — BASIC METABOLIC PANEL
Anion Gap: 9 (ref 7–16)
Chloride: 114 mmol/L — ABNORMAL HIGH (ref 98–107)
Co2: 19 mmol/L — ABNORMAL LOW (ref 21–32)
Creatinine: 1.01 mg/dL (ref 0.60–1.30)
Potassium: 4.3 mmol/L (ref 3.5–5.1)
Sodium: 142 mmol/L (ref 136–145)

## 2012-06-28 LAB — CBC WITH DIFFERENTIAL/PLATELET
Basophil %: 1.2 %
Eosinophil %: 1.9 %
HCT: 36.4 % (ref 35.0–47.0)
HGB: 11.7 g/dL — ABNORMAL LOW (ref 12.0–16.0)
Lymphocyte #: 3.3 10*3/uL (ref 1.0–3.6)
MCH: 27.2 pg (ref 26.0–34.0)
MCV: 85 fL (ref 80–100)
Monocyte #: 0.6 x10 3/mm (ref 0.2–0.9)
Monocyte %: 7.9 %
Neutrophil #: 3.9 10*3/uL (ref 1.4–6.5)
RBC: 4.3 10*6/uL (ref 3.80–5.20)

## 2012-06-28 LAB — LIPID PANEL
Cholesterol: 99 mg/dL (ref 0–200)
Ldl Cholesterol, Calc: 51 mg/dL (ref 0–100)
Triglycerides: 92 mg/dL (ref 0–200)
VLDL Cholesterol, Calc: 18 mg/dL (ref 5–40)

## 2012-06-29 LAB — CBC WITH DIFFERENTIAL/PLATELET
Basophil %: 1.3 %
Eosinophil %: 2.7 %
HGB: 12.3 g/dL (ref 12.0–16.0)
Lymphocyte #: 2.4 10*3/uL (ref 1.0–3.6)
Lymphocyte %: 41.7 %
MCHC: 33.2 g/dL (ref 32.0–36.0)
Monocyte %: 8.2 %
Neutrophil #: 2.7 10*3/uL (ref 1.4–6.5)
RBC: 4.39 10*6/uL (ref 3.80–5.20)

## 2012-06-29 LAB — BASIC METABOLIC PANEL
Anion Gap: 9 (ref 7–16)
Calcium, Total: 9.1 mg/dL (ref 8.5–10.1)
Chloride: 115 mmol/L — ABNORMAL HIGH (ref 98–107)
EGFR (African American): >60
EGFR (Non-African Amer.): >60
Glucose: 93 mg/dL (ref 65–99)
Osmolality: 281 (ref 275–301)
Potassium: 4.1 mmol/L (ref 3.5–5.1)

## 2012-06-30 LAB — CBC WITH DIFFERENTIAL/PLATELET
Basophil #: 0.1 10*3/uL (ref 0.0–0.1)
Basophil %: 1 %
Eosinophil #: 0.2 10*3/uL (ref 0.0–0.7)
HCT: 36.7 % (ref 35.0–47.0)
Lymphocyte #: 2.3 10*3/uL (ref 1.0–3.6)
Lymphocyte %: 43 %
Monocyte #: 0.5 x10 3/mm (ref 0.2–0.9)
Neutrophil #: 2.3 10*3/uL (ref 1.4–6.5)
Platelet: 148 10*3/uL — ABNORMAL LOW (ref 150–440)
RDW: 14.8 % — ABNORMAL HIGH (ref 11.5–14.5)
WBC: 5.3 10*3/uL (ref 3.6–11.0)

## 2012-06-30 LAB — BASIC METABOLIC PANEL
Anion Gap: 11 (ref 7–16)
Calcium, Total: 9 mg/dL (ref 8.5–10.1)
Co2: 16 mmol/L — ABNORMAL LOW (ref 21–32)
EGFR (African American): >60
EGFR (Non-African Amer.): >60
Glucose: 89 mg/dL (ref 65–99)
Sodium: 143 mmol/L (ref 136–145)

## 2012-07-01 LAB — BASIC METABOLIC PANEL
Anion Gap: 10 (ref 7–16)
BUN: 14 mg/dL (ref 7–18)
Chloride: 117 mmol/L — ABNORMAL HIGH (ref 98–107)
EGFR (African American): >60
Glucose: 119 mg/dL — ABNORMAL HIGH (ref 65–99)
Osmolality: 290 (ref 275–301)
Potassium: 4.1 mmol/L (ref 3.5–5.1)
Sodium: 145 mmol/L (ref 136–145)

## 2012-07-01 LAB — CBC WITH DIFFERENTIAL/PLATELET
Eosinophil %: 3.4 %
HCT: 38.4 % (ref 35.0–47.0)
Lymphocyte #: 2.7 10*3/uL (ref 1.0–3.6)
MCV: 84 fL (ref 80–100)
Monocyte %: 9.5 %
Neutrophil #: 2.5 10*3/uL (ref 1.4–6.5)
Neutrophil %: 41.7 %
Platelet: 163 10*3/uL (ref 150–440)
RBC: 4.57 10*6/uL (ref 3.80–5.20)
WBC: 6 10*3/uL (ref 3.6–11.0)

## 2012-07-02 LAB — BASIC METABOLIC PANEL
BUN: 16 mg/dL (ref 7–18)
Creatinine: 0.9 mg/dL (ref 0.60–1.30)
EGFR (Non-African Amer.): >60
Glucose: 81 mg/dL (ref 65–99)
Osmolality: 280 (ref 275–301)
Potassium: 4.4 mmol/L (ref 3.5–5.1)
Sodium: 140 mmol/L (ref 136–145)

## 2012-07-02 LAB — CBC WITH DIFFERENTIAL/PLATELET
Eosinophil: 5 %
HCT: 37.7 % (ref 35.0–47.0)
HGB: 12.6 g/dL (ref 12.0–16.0)
Lymphocytes: 36 %
Monocytes: 4 %
Platelet: 171 10*3/uL (ref 150–440)
Segmented Neutrophils: 28 %
WBC: 6.2 10*3/uL (ref 3.6–11.0)

## 2012-07-04 LAB — CBC WITH DIFFERENTIAL/PLATELET
Basophil #: 0 10*3/uL (ref 0.0–0.1)
Basophil %: 0.2 %
Eosinophil %: 4.2 %
HCT: 38 % (ref 35.0–47.0)
HGB: 12.4 g/dL (ref 12.0–16.0)
Lymphocyte #: 2.8 10*3/uL (ref 1.0–3.6)
MCH: 27.4 pg (ref 26.0–34.0)
MCHC: 32.6 g/dL (ref 32.0–36.0)
MCV: 84 fL (ref 80–100)
Monocyte #: 0.5 x10 3/mm (ref 0.2–0.9)
Neutrophil #: 2.5 10*3/uL (ref 1.4–6.5)
Neutrophil %: 41.2 %
Platelet: 176 10*3/uL (ref 150–440)
RBC: 4.52 10*6/uL (ref 3.80–5.20)

## 2012-07-04 LAB — BASIC METABOLIC PANEL
Calcium, Total: 9.2 mg/dL (ref 8.5–10.1)
Co2: 19 mmol/L — ABNORMAL LOW (ref 21–32)
Creatinine: 1.03 mg/dL (ref 0.60–1.30)
Glucose: 103 mg/dL — ABNORMAL HIGH (ref 65–99)
Sodium: 140 mmol/L (ref 136–145)

## 2012-07-05 LAB — BASIC METABOLIC PANEL
Calcium, Total: 9.6 mg/dL (ref 8.5–10.1)
Chloride: 109 mmol/L — ABNORMAL HIGH (ref 98–107)
Co2: 18 mmol/L — ABNORMAL LOW (ref 21–32)
Creatinine: 0.82 mg/dL (ref 0.60–1.30)
Osmolality: 276 (ref 275–301)
Potassium: 4.3 mmol/L (ref 3.5–5.1)
Sodium: 138 mmol/L (ref 136–145)

## 2012-07-06 ENCOUNTER — Ambulatory Visit: Payer: Self-pay | Admitting: Internal Medicine

## 2012-07-26 ENCOUNTER — Ambulatory Visit: Payer: Self-pay

## 2012-08-26 DEATH — deceased

## 2013-08-28 IMAGING — CT CT SIM MISC
1 series · 11 of 14 positions shown, 14 images · non-contrast
Comparison: none

[Series 2: — · axial · 0.98mm/px · z∈[-500,-320]mm · 11 of 78 slices shown, 14 images]
[im 6/78  soft-tissue]
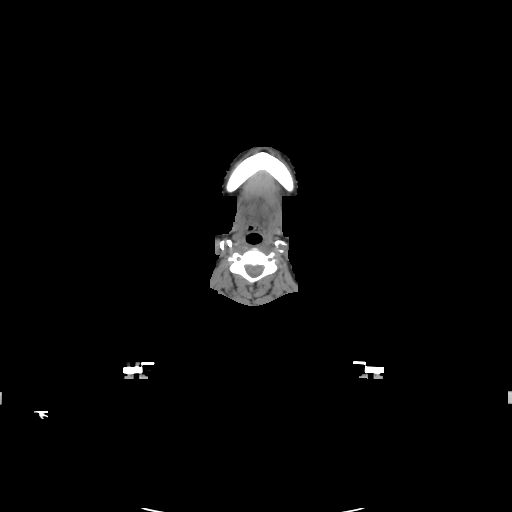
[im 6/78  bone]
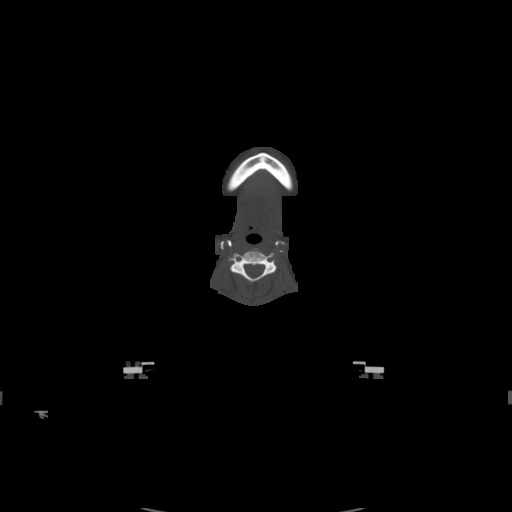
[im 12/78  bone]
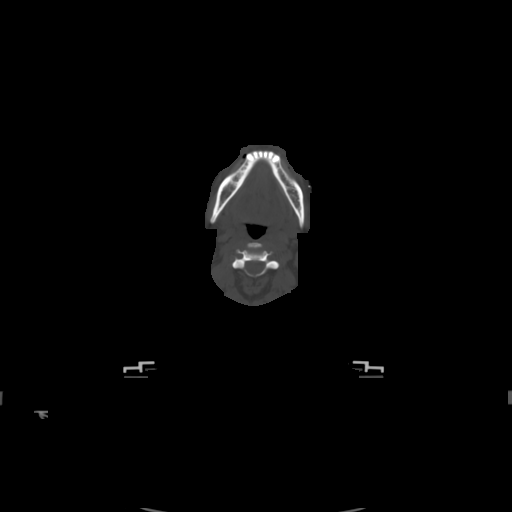
[im 18/78  bone]
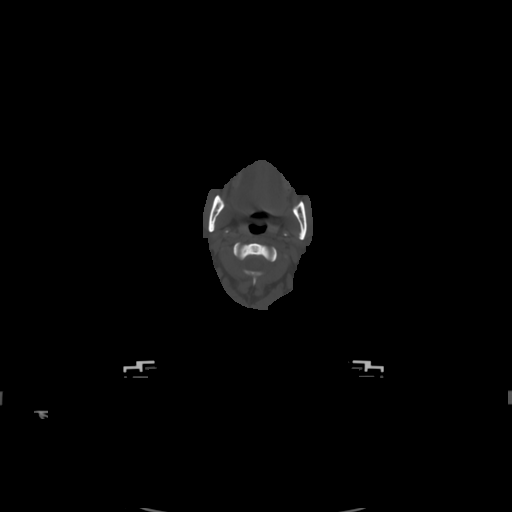
[im 24/78  bone]
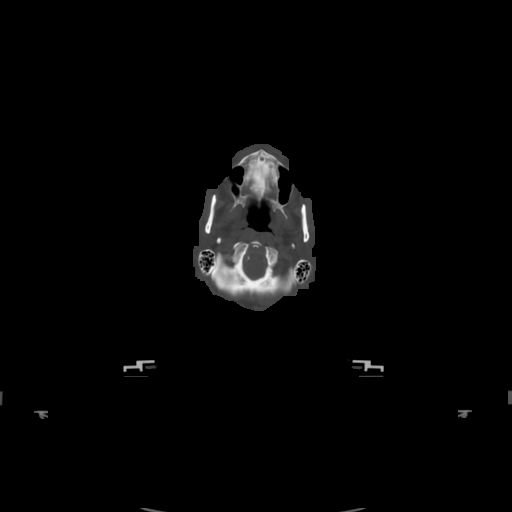
[im 30/78  soft-tissue]
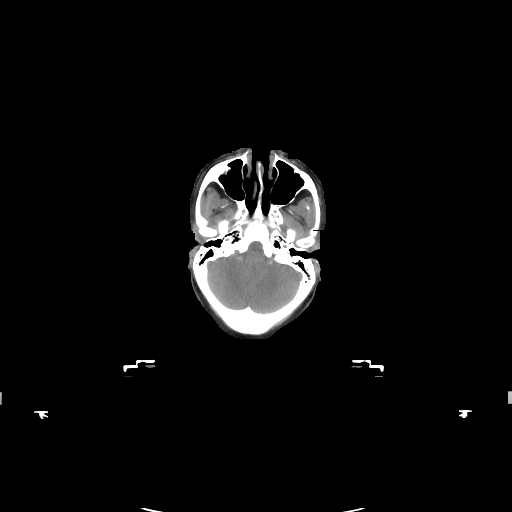
[im 30/78  bone]
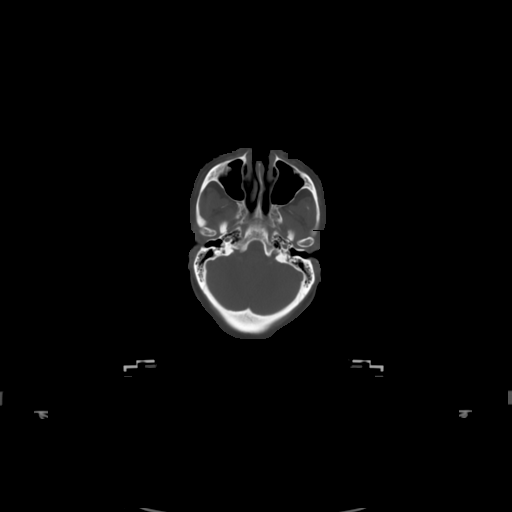
[im 36/78  bone]
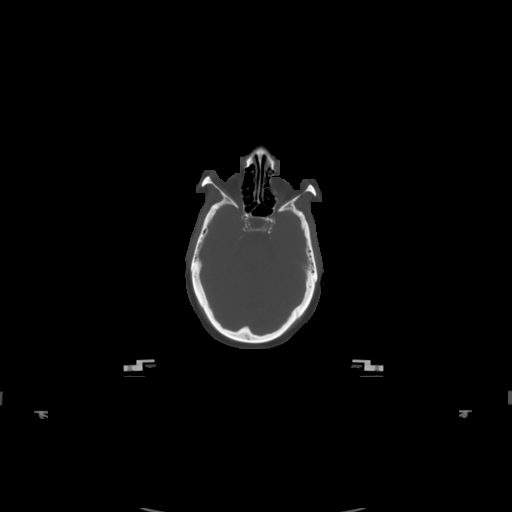
[im 42/78  bone]
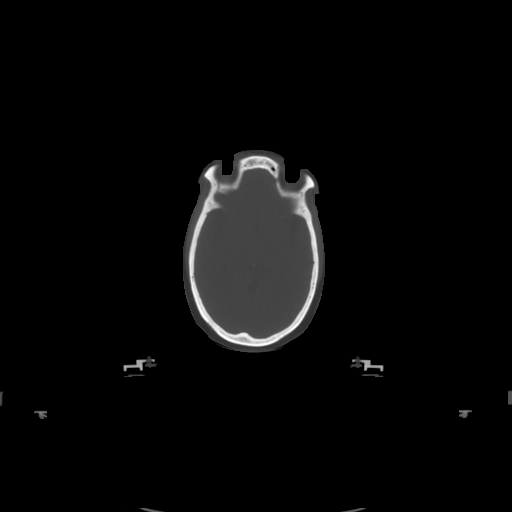
[im 48/78  bone]
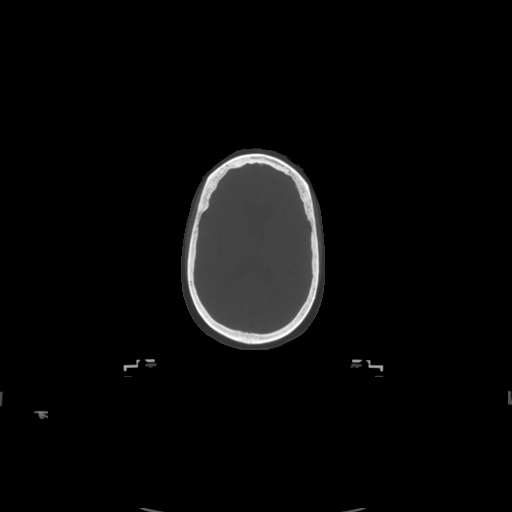
[im 54/78  soft-tissue]
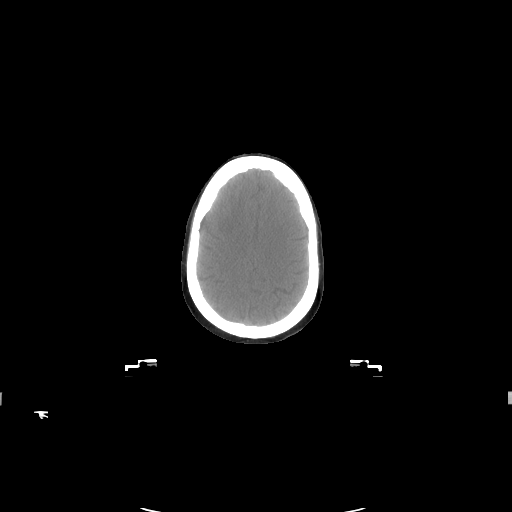
[im 54/78  bone]
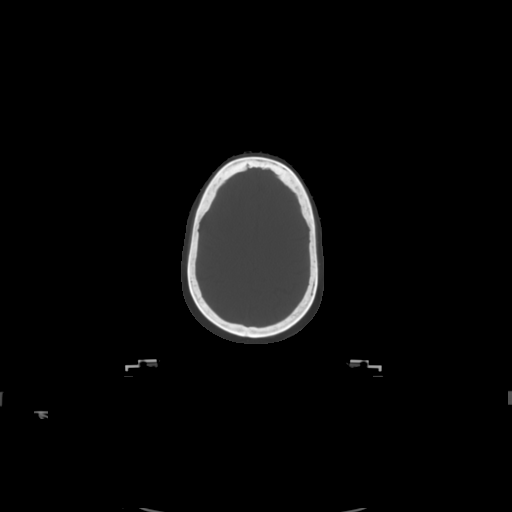
[im 60/78  bone]
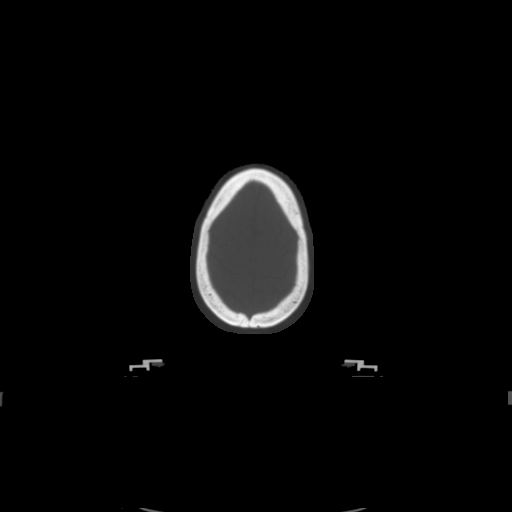
[im 66/78  bone]
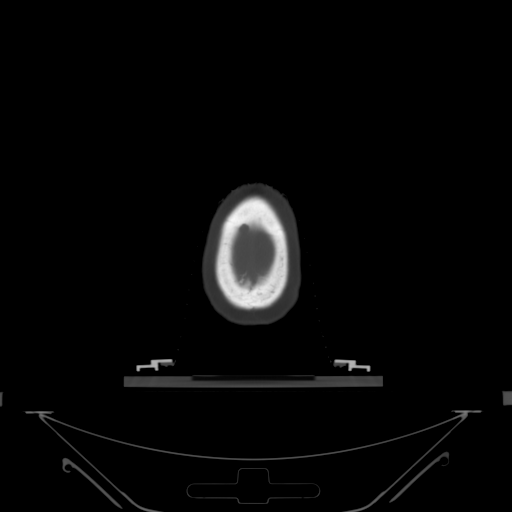

[11 of 14 positions shown; findings below may reference images not displayed]

IMAGES IMPORTED FROM THE [employer] WORKFLOW SYSTEM
NO DICTATION FOR STUDY

## 2013-09-26 IMAGING — US US EXTREM LOW VENOUS BILAT
1 series · 17 of 24 positions shown · non-contrast
Comparison: none

REASON FOR EXAM: cancer patient, new onset bilateral leg pain
COMMENTS:

[Series 1: us extrem low venous bilat · 17 of 52 slices shown]
[im 1/52]
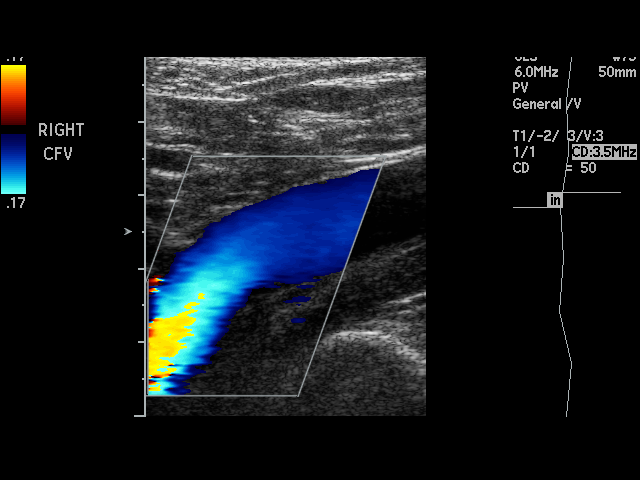
[im 5/52]
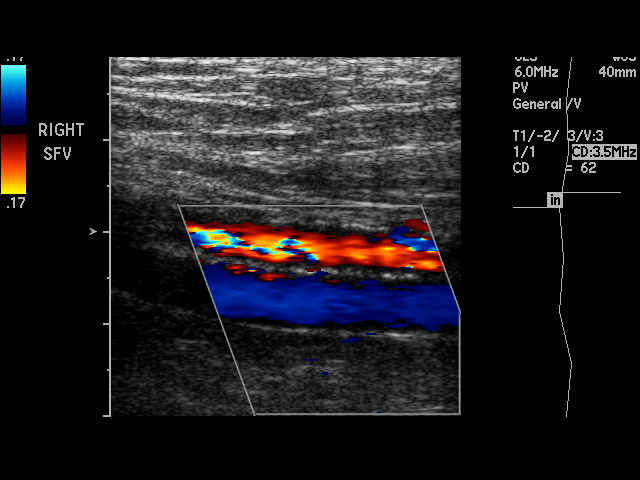
[im 7/52]
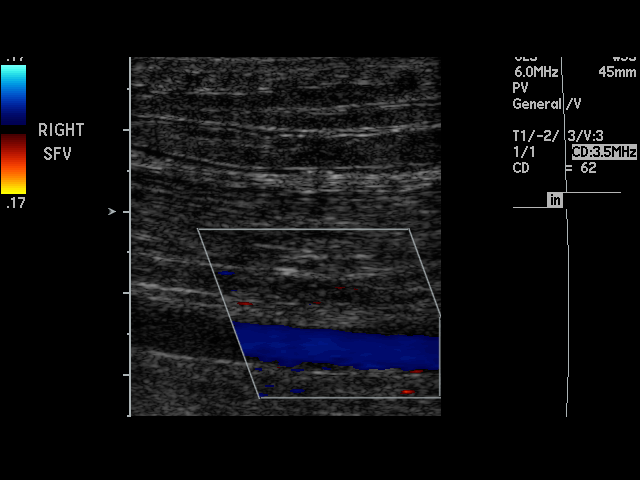
[im 9/52]
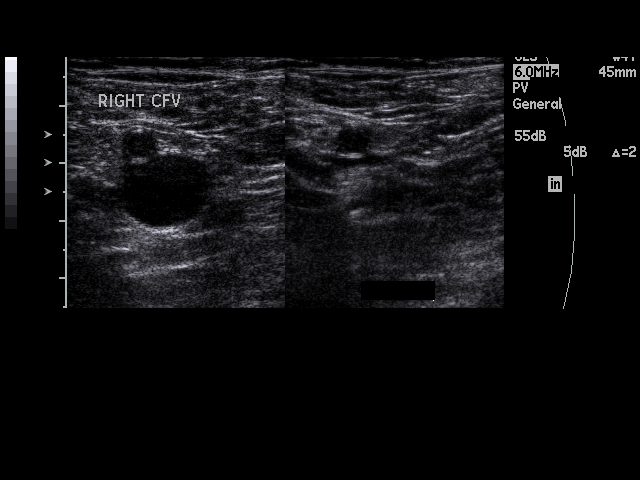
[im 14/52]
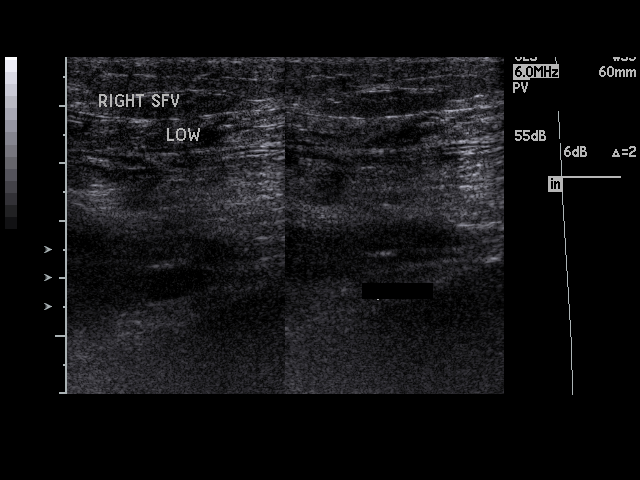
[im 16/52]
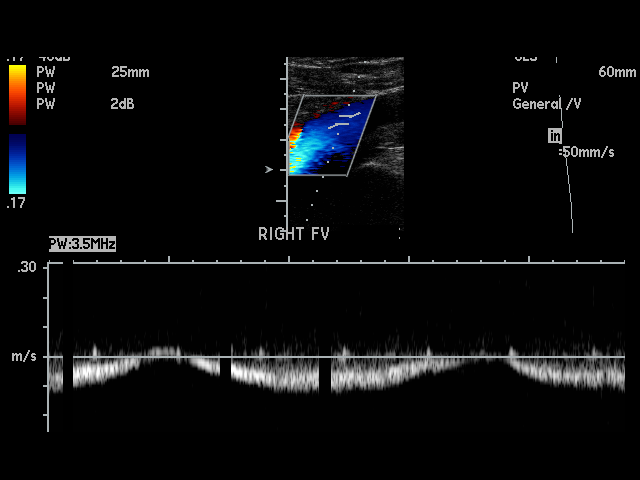
[im 20/52]
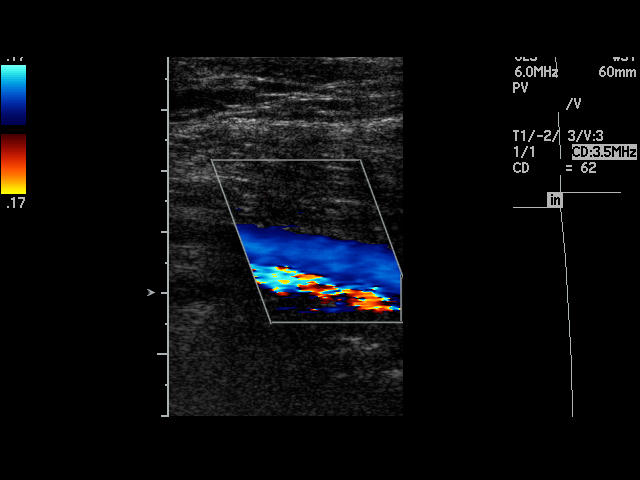
[im 23/52]
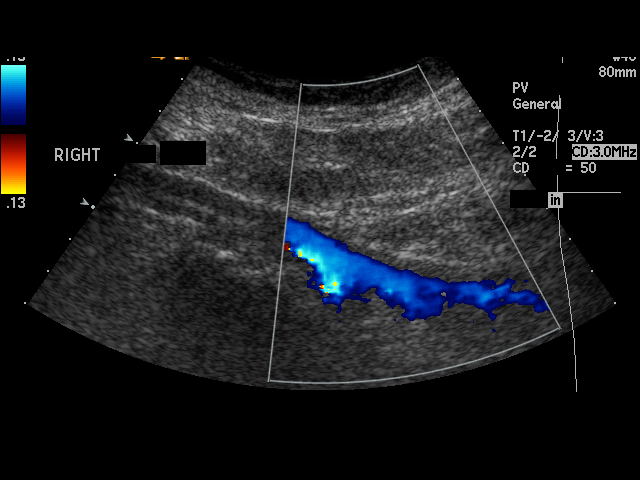
[im 27/52]
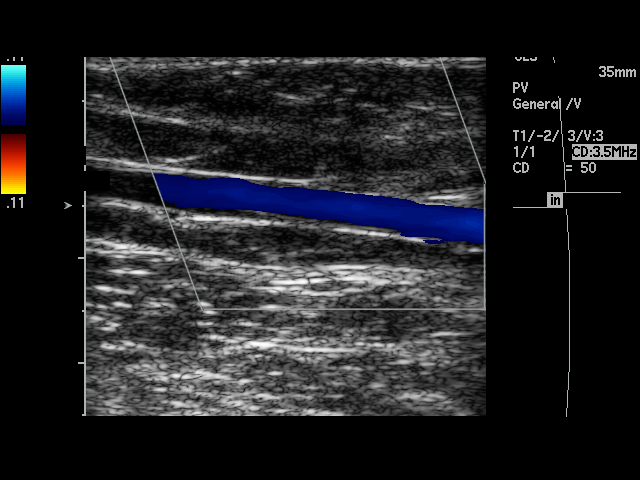
[im 29/52]
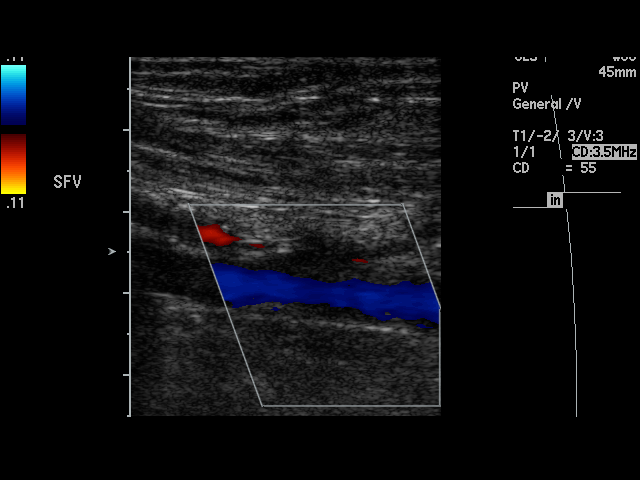
[im 32/52]
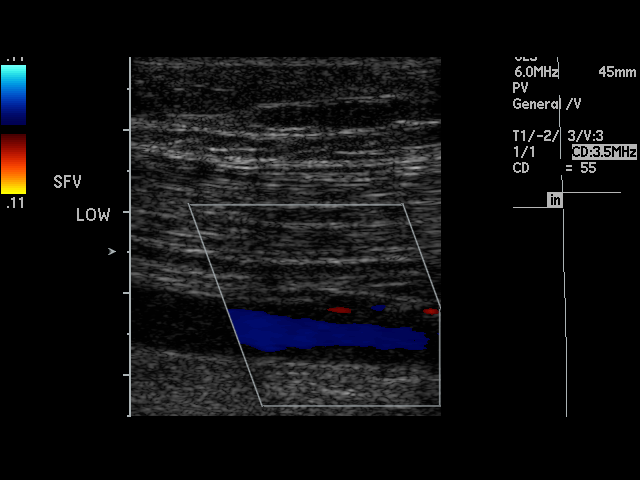
[im 36/52]
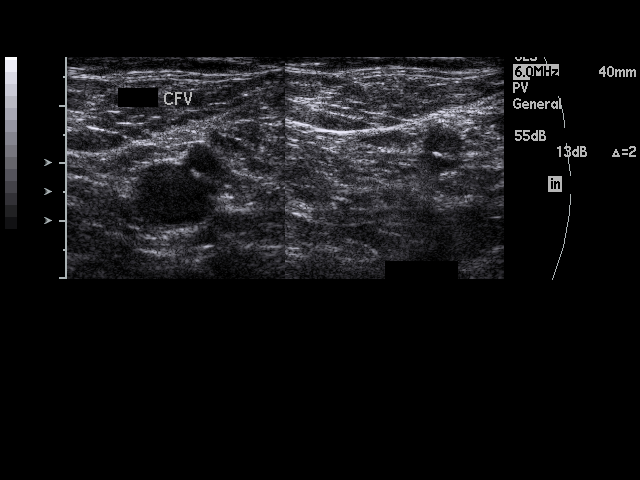
[im 38/52]
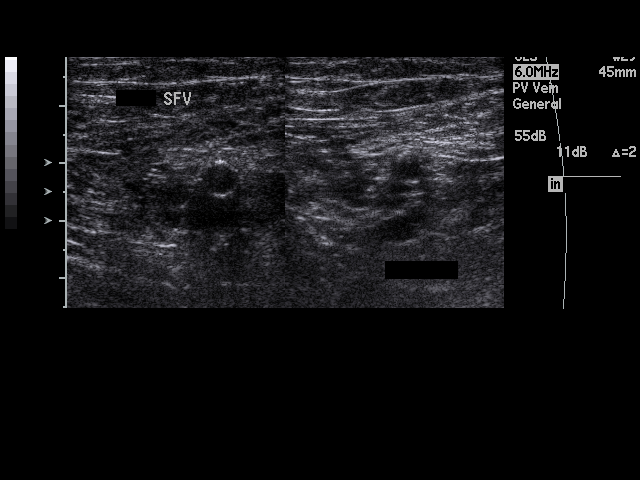
[im 43/52]
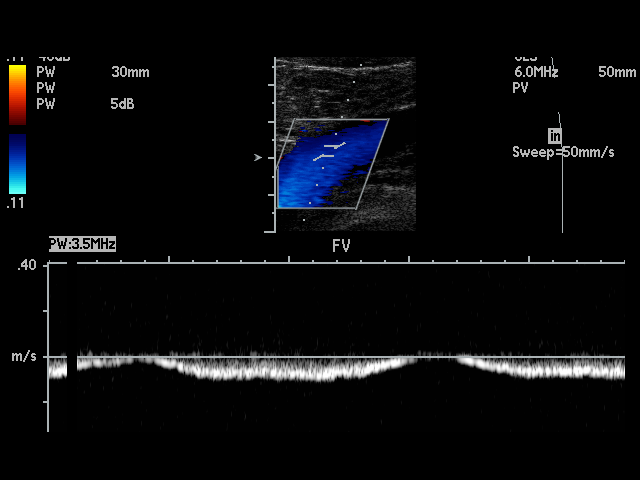
[im 45/52]
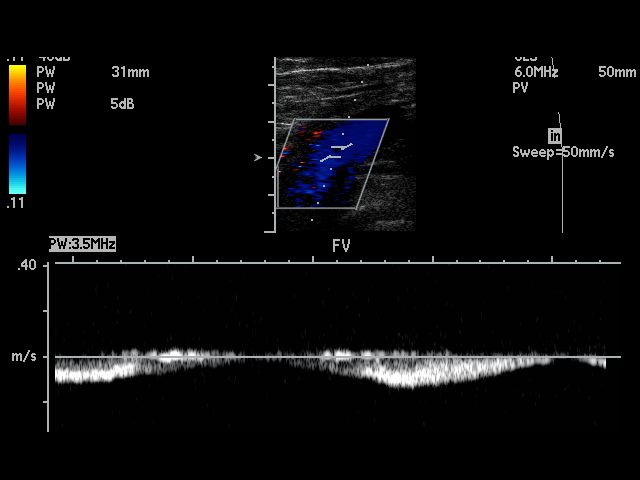
[im 47/52]
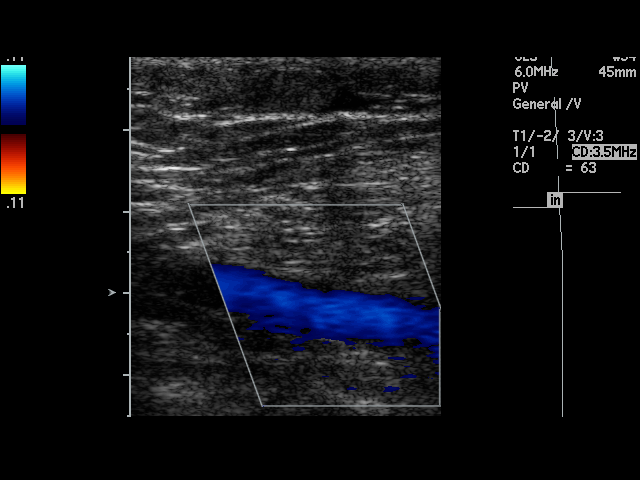
[im 52/52]
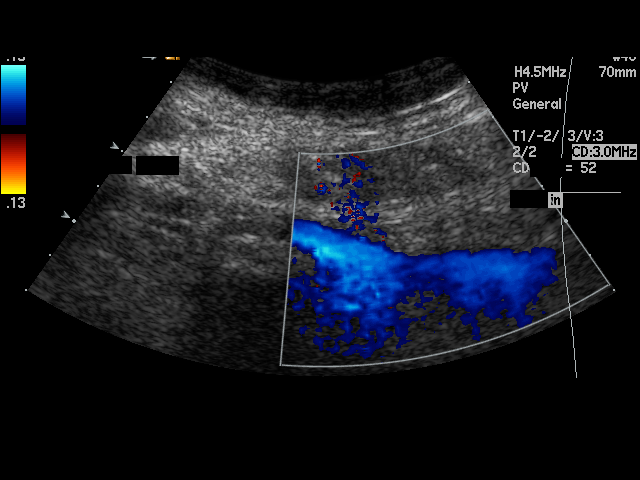

[17 of 24 positions shown; findings below may reference images not displayed]

PROCEDURE:     US  - US DOPPLER LOW EXTR BILATERAL  - November 11, 2011  [DATE]

RESULT:     Technique: Gray scale, Duplex color flow and SPECTRAL waveform
imaging was performed of the deep venous structures of the LEFT lower
extremity.

There is not evidence of increased echogenicity, non-compressibility,
abnormal waveform or abnormal grayscale flow with the interrogated deep
venous structures of the LEFT lower extremity. There is appropriate response
to Valsalva and augmentation within the interrogated vessels.
IMPRESSION: 1. No sonographic evidence of a deep venous thrombus within the interrogated
vessels of the LEFT lower extremity.
2. Dr. Binjam of the emergency department was informed of these findings
via a preliminary faxed report.

## 2013-09-27 IMAGING — US ABDOMEN ULTRASOUND
1 series · 14 of 25 positions shown · non-contrast
Comparison: none

REASON FOR EXAM: abdominal pain
COMMENTS:

[Series 1: abdomen ultrasound · 0.35mm/px · 14 of 72 slices shown]
[im 1/72]
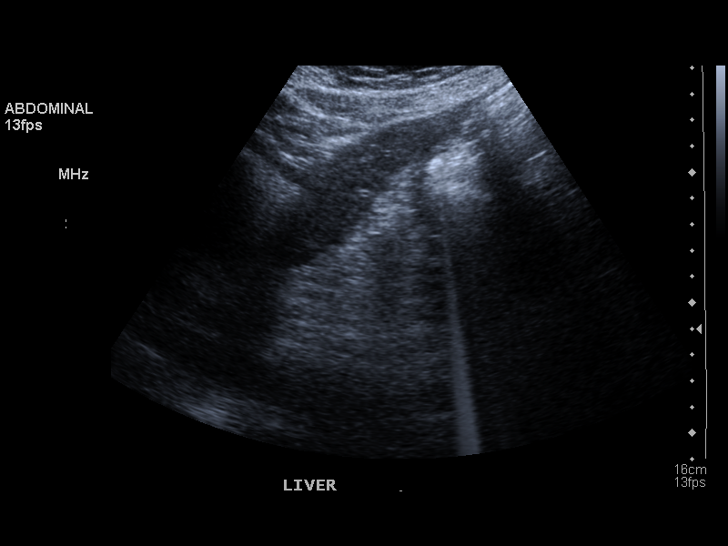
[im 6/72]
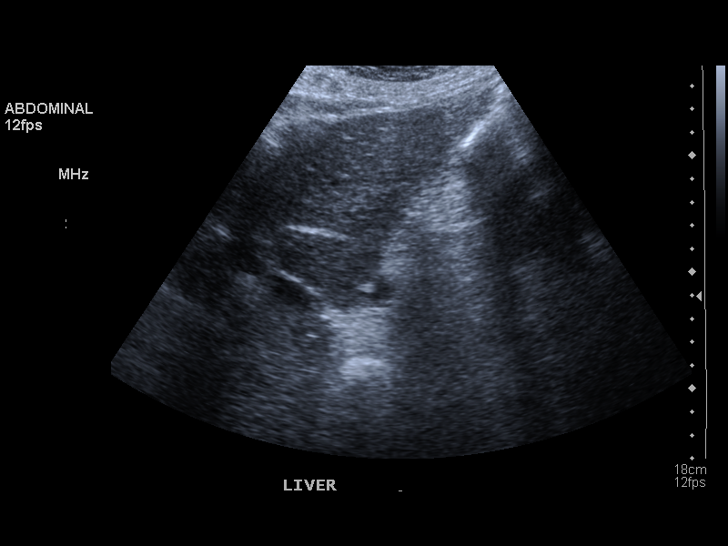
[im 12/72]
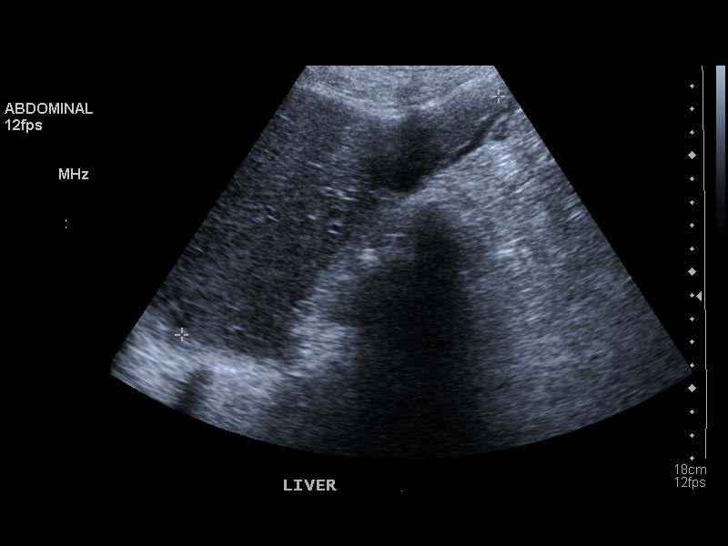
[im 18/72]
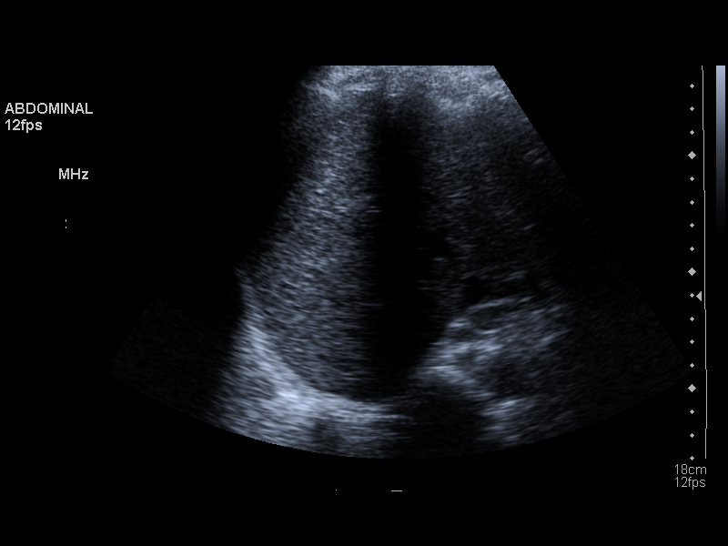
[im 24/72]
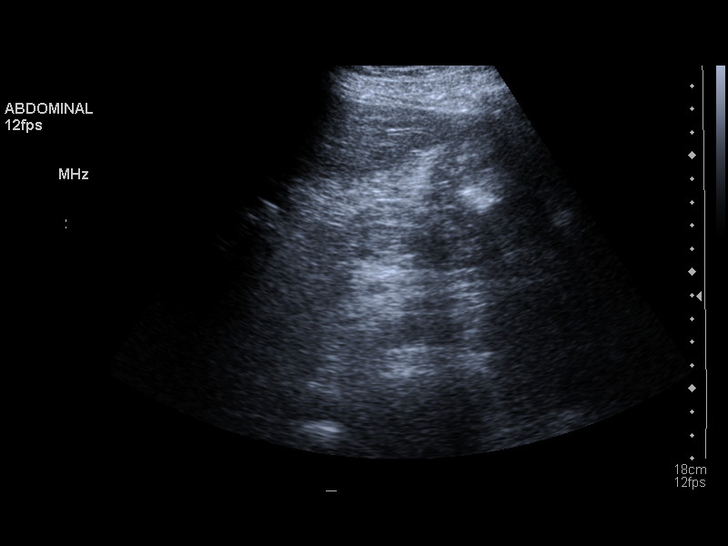
[im 27/72]
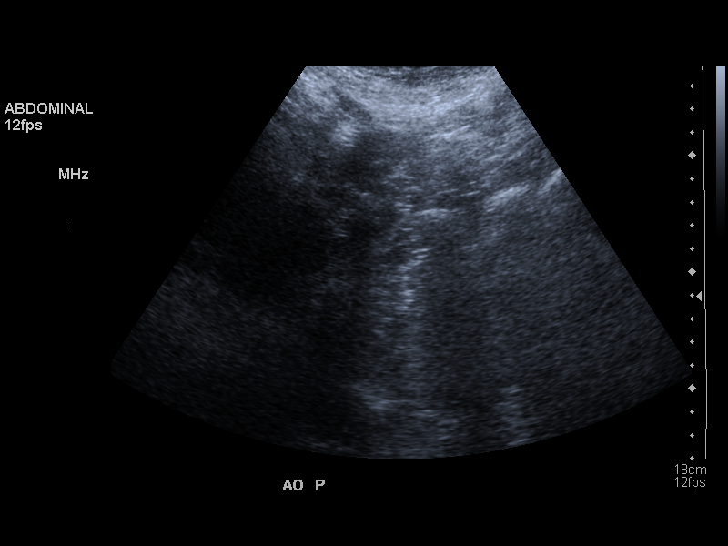
[im 33/72]
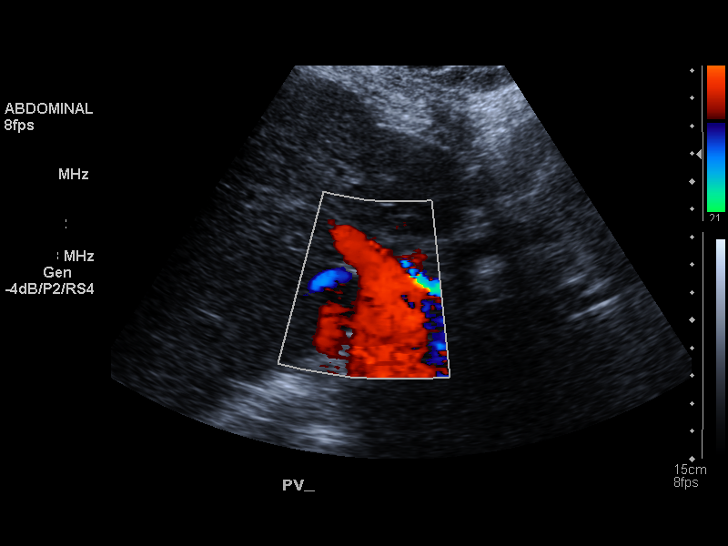
[im 39/72]
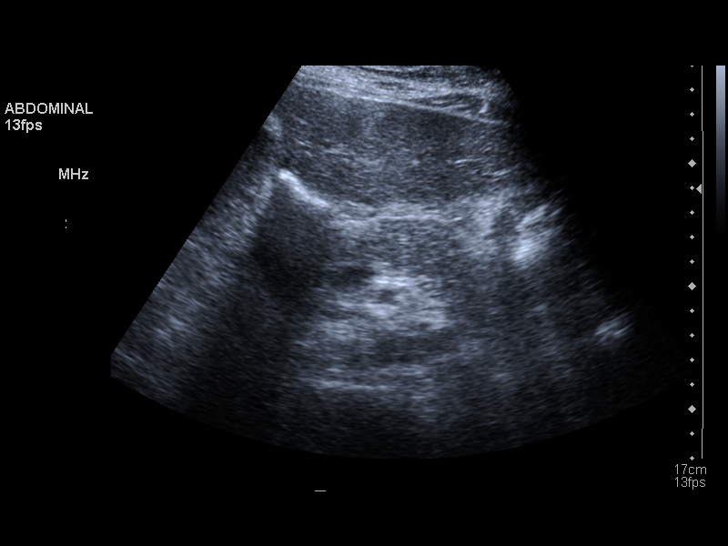
[im 45/72]
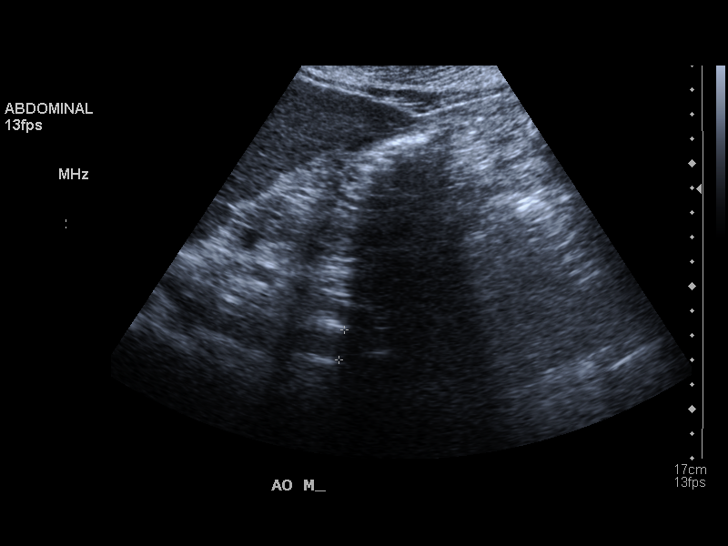
[im 48/72]
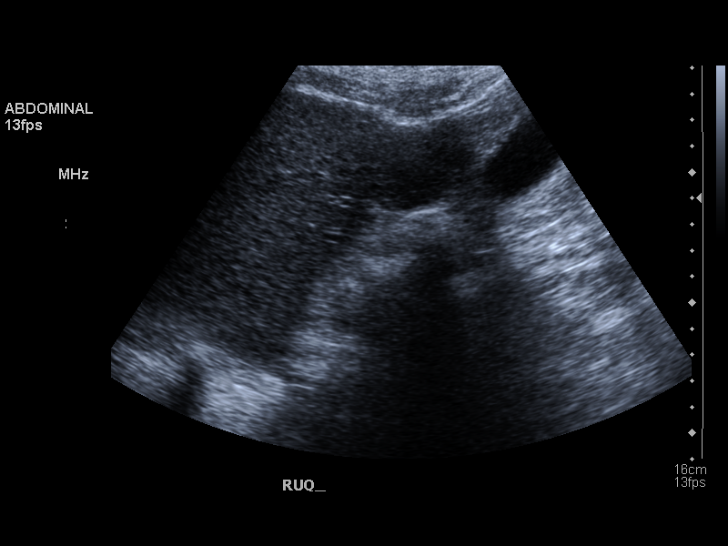
[im 54/72]
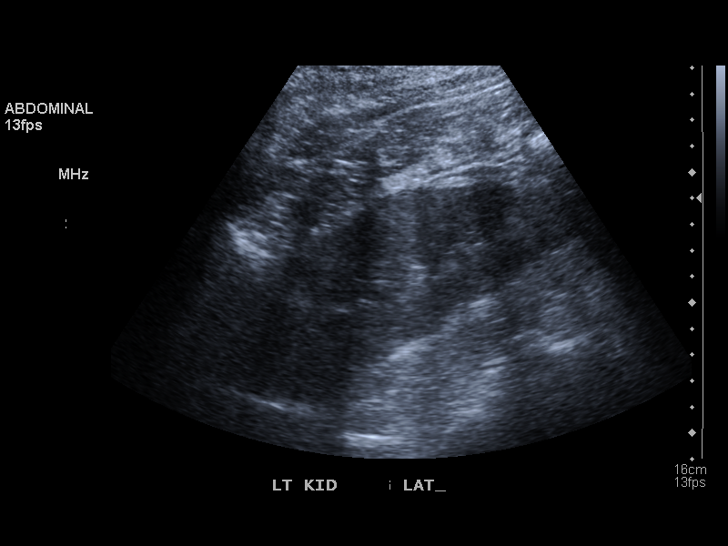
[im 60/72]
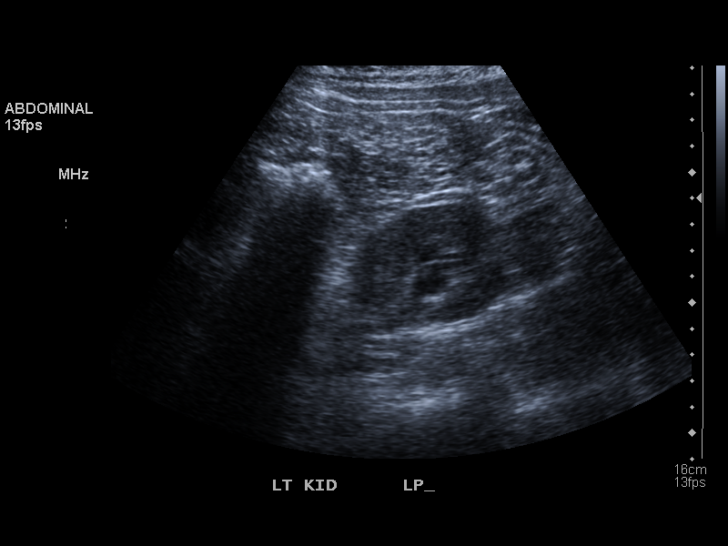
[im 66/72]
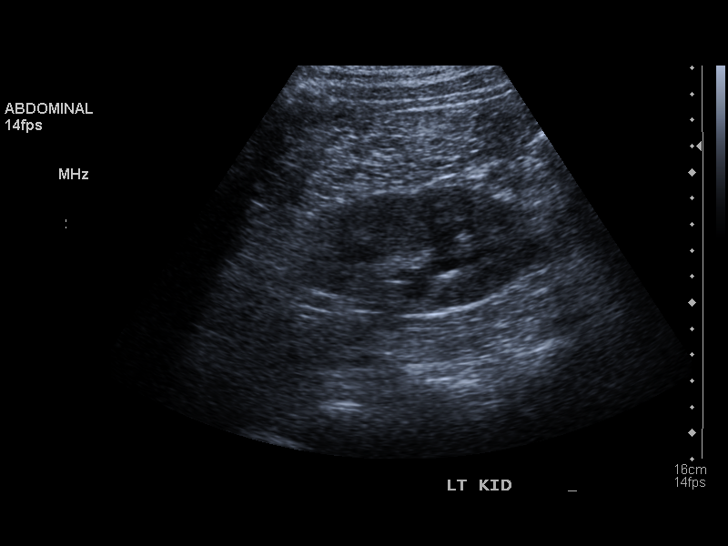
[im 72/72]
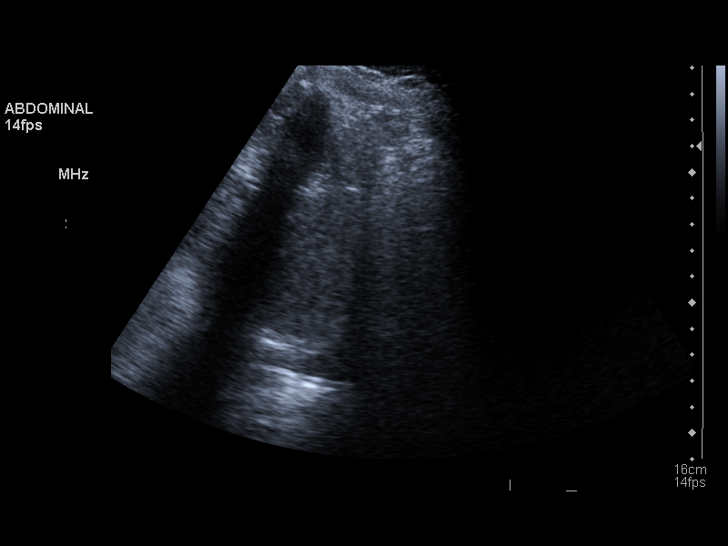

[14 of 25 positions shown; findings below may reference images not displayed]

PROCEDURE:     US  - US ABDOMEN GENERAL SURVEY  - November 12, 2011  [DATE]

RESULT:     Abdominal sonogram demonstrates a normal-appearing gallbladder
with a wall thickness of 2.6 mm. The common bile duct diameter is 2.7 mm.
The right kidney could not be located. The left kidney appears normal. The
aorta and inferior vena cava proximal portions are seen and appear normal.
There is a small amount of ascites adjacent to the spleen. A small amount of
ascites is also present in the perihepatic region. The liver is borderline
enlarged.
IMPRESSION: 1. Limited visualization of the pancreas, inferior vena cava and aorta
without gross abnormality.
2. Small amount of ascites present.
3. Borderline hepatomegaly.
4. Nonvisualization of the right kidney. Correlate with history.

## 2015-02-12 NOTE — Consult Note (Signed)
Brief Consult Note: Diagnosis: Left humerus fracture.   Patient was seen by consultant.   Comments: Patient is a 64 y/o with multiple medical problems including metastatic breast cancer.  She was admitted to the hospital after being found down at home.  The patient is seen this evening with her son at the bedside.  Patient is awake but can't provide an accurate history.  She does not recall injuring her left arm.  I have reviewed the patient's medical history from her admission H&P.  On examination of her left arm, her skin is intact.  She has intact sensation to light touch throughout the left upper extremity.  She can flex and extend her fingers.  She is currently in a shoulder immobilizer.  ROM was not tested.  There is no significant swelling or ecchymosis.  There is no obvious deformity.  On her left humerus radiographs, the patient has a non-displaced fracture of the humerus within the proximal 1/3.  The fracture is nondisplaced. The fracture appears to be within an area of lucency within the humerus, possibly a metastatic lesion.  I recommend continued immobilization of the left humerus in a sling or shoulder immobilizer.  I will discuss with the oncologists to determine whether further workup for a bone metatstasis is warranted at this time.  There is no immediate plan for surgical intervention.  Electronic Signatures: Thornton Park (MD)  (Signed 03-Sep-13 19:49)  Authored: Brief Consult Note   Last Updated: 03-Sep-13 19:49 by Thornton Park (MD)

## 2015-02-12 NOTE — Discharge Summary (Signed)
PATIENT NAME:  Kara Flowers, Kara Flowers MR#:  [id number] DATE OF BIRTH:  [birth year]  DATE OF ADMISSION:  06/27/2012 DATE OF DISCHARGE:  07/05/2012  DISCHARGE DIAGNOSES:  1. Altered mental status with confusion.  2. History of breast cancer with brain metastasis.  3. Pathological fracture involving left humerus.  4. Type 2 diabetes.  5. History of hepatitis B and hepatitis C.  6. Torticollis of neck.  7. History of alcohol and substance abuse in the past.  8. Generalized weakness.   CHIEF COMPLAINT: Confusion, pain in the left upper extremity.   HISTORY OF PRESENT ILLNESS: Kara Flowers is a 64 year old African American female with a history of type 2 diabetes, breast cancer with metastasis, history of hypertension who presented to the ED with confusion of 3 to 4 days' duration. The patient had been having hospice care at home with and was also DO NOT RESUSCITATE. She reportedly was found in the floor and appeared to be confused and complaining of pain in the left arm. The patient was noted to have an elevated troponin and CPK. Left arm x-ray showed pathologic fracture. Initial CT scan of the head showed possible cerebrovascular accident, but subsequent MRI did not show evidence for cerebrovascular accident.   PAST MEDICAL HISTORY:  1. Hypertension. 2. Type 2 diabetes. 3. Breast cancer with brain metastasis. 4. History of alcohol use. 5. Gastroesophageal reflux disease.  6. Thrombocytopenia.  7. Hepatitis B and hepatitis C.   PAST SURGICAL HISTORY:  1. Port-A-Cath placement.  2. Mastectomy of right side. 3. Partial hysterectomy.  4. Renal cancer status post right nephrectomy.   PHYSICAL EXAMINATION:  VITAL SIGNS: Temperature 99.5, blood pressure 130/78, pulse 84, respirations 18, oxygen sat 99% on room air. She was confused, appeared to be in moderate pain.   HEENT: Normocephalic, atraumatic.   HEART: S1, S2.   NECK: She had evidence of torticollis and had difficulty in moving her  head to the left side.   ABDOMEN: Soft, nontender.   EXTREMITIES: Evidence of tenderness noted with decreased range of motion of the left upper extremity. No pedal edema.   NEUROLOGIC: She was alert but confused, sometimes uncooperative and combative.   LABORATORY, RADIOLOGICAL AND DIAGNOSTIC DATA: X-ray of the humerus showed pathologic fracture involving the proximal third of left humeral shaft. Chest x-ray did not show any acute pulmonary disease. Right hip did not show any evidence of any fracture. Right shoulder x-ray: No acute bony abnormality. CT of the head showed a possible tiny left basal ganglia lacunar infarct. CT of the cervical spine without contrast showed no cervical spine bony abnormalities. WBCs 7.1, hemoglobin 13.3, platelets 160, glucose 104, BUN 18, creatinine 1.07, sodium 138, potassium 3.8, chloride 110, bicarbonate 19, troponin 0.25, CK 1,128. MB 3.8. EKG shows sinus tachycardia with heart rate of 103 beats per minute.   HOSPITAL COURSE: The patient was admitted to [facility] and started on intravenous fluids. She was noted to have metabolic acidosis and her Topamax was discontinued. She also received morphine for pain. She was seen in consultation by orthopedics with Dr. Thornton Park who felt the fracture was undisplaced and recommended continued immobilization of left humerus in a sling or shoulder immobilizer. Consultation was also done by Dr. Inez Pilgrim who felt that she may benefit from palliative radiotherapy. In addition, she was seen by palliative care and also psychiatrist, Dr. Nicolasa Ducking. The patient was continued on her trazodone and also placed on insulin sliding scale for her diabetes. Discussions were held with the  family and after multiple such discussions with the help of care management, it was felt that the patient would benefit from rehab. She was therefore transferred in stable condition to rehab.  DISCHARGE MEDICATIONS: 1. Pancrelipase 12,000  units 2 capsules t.i.d.  2. Zoloft 50 mg p.o. daily.  3. Spironolactone 25 mg p.o. b.i.d.  4. Trazodone 100 mg at bedtime.  5. Aspirin 325 mg p.o. daily.  6. Fluticasone nasal spray two sprays both nostrils daily.  7. Docusate sodium 100 mg p.o. b.i.d.  8. Morphine Sulfate 10 mg po bid prn 9. Cyclobenzaprine 5 mg p.o. every 12 hours p.r.n.  DISCHARGE INSTRUCTIONS:  1. The patient has been advised to keep her left arm in a sling.  2. Follow up with Dr. Ginette Pitman in 1 to 2 weeks. 3. Follow-up with Dr. Inez Pilgrim in 1 to 2 weeks' time.   Time spent in discharging pt and co ordinting care: 45 minutes ____________________________ Tracie Harrier, MD vh:ap D: 07/05/2012 08:28:55 ET T: 07/05/2012 08:57:47 ET JOB#: [id number]  cc: Tracie Harrier, MD, <Dictator> Tracie Harrier MD ELECTRONICALLY SIGNED 07/18/2012 13:04

## 2015-02-12 NOTE — Consult Note (Signed)
PATIENT NAME:  Kara Flowers, STARGELL MR#:  [id number] DATE OF BIRTH:  09-03-1951  DATE OF CONSULTATION:  06/28/2012  REFERRING PHYSICIAN:  Dr. Ginette Pitman  CONSULTING PHYSICIAN:  Douglass Dunshee R. Ma Hillock, MD  REASON FOR CONSULTATION: Breast cancer, pathological fracture of the proximal third of the left humeral shaft.   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 Inez Pilgrim in the past. She was last seen by him in January 2013. She has not received any recent systemic therapy and has been on home Hospice. She was admitted to the hospital on September 2nd with confusion of 3 to 4 days duration, reportedly was found on the floor at home prior to admission and complained of left upper arm pain. X-ray shows likely pathological fracture in the proximal third of the left humerus. The patient does complain of some pain in this area; otherwise, she is awake but lethargic and answers simple questions appropriately. She indicates that she is very weak. Appetite is poor lately. Denies any fever or chills. Denies obvious bleeding symptoms. Orthopedic evaluation is pending at this time.   FAMILY HISTORY: Family history per chart record H and P remarkable for PE, diabetes, hypertension, and heart disease.   SOCIAL HISTORY PER CHART RECORD: History of smoking. No history of alcohol abuse.   ALLERGIES: Penicillin, Percocet, codeine.   HOME MEDICATIONS PER CHART RECORD:  1. Fluticasone 50 mcg/inhaler nasal spray one spray once daily.  2. Humalog insulin 30 units 3 times a day before meals.  3. Creon  12,000 units 2 capsules orally.  4. Lantus insulin 70 units daily.  5. Magnesium oxide 400 mg b.i.d.  6. Meloxicam 7.5 mg daily.  7. MiraLAX powder once daily.  8. Ondansetron 4 mg q.8 hours.  9. Phenergan 25 mg q.8 hours p.r.n.  10. Spironolactone 25 mg b.i.d.  11. Topiramate 25 mg b.i.d.  12. Trazodone 100 mg at bedtime.   REVIEW OF SYSTEMS: Review of systems is limited. The patient is slightly lethargic and answers simple questions only. Denies any recent fevers. Currently denies any headaches. Denies any chest pain, shortness of breath at rest, or hemoptysis. Denies any abdominal pain, vomiting, or diarrhea. Has left arm pain. Denies any other major pain issues at this time.   PHYSICAL EXAMINATION:   GENERAL: The patient is very weak, slightly lethargic but awake, otherwise, no acute distress. No icterus.   VITAL SIGNS: Temperature 98.8, pulse 66, respiratory rate 20, blood pressure 127/67, 97% on room air.   HEENT: Normocephalic, atraumatic. Extraocular movements intact. Mouth is dry. No thrush.   NECK: Negative for lymphadenopathy.   CARDIOVASCULAR: S1, S2, regular rate and rhythm.   LUNGS: Lungs show bilateral diminished breath sounds more so at the bases. No rhonchi.   ABDOMEN: Soft, nontender.   EXTREMITIES: Left arm is in sling. Lower extremities show no major edema or cyanosis.   NEUROLOGIC: Limited examination. Moves all extremities spontaneously.   SKIN: No generalized rashes or major bruising.   LABORATORY, DIAGNOSTIC, AND RADIOLOGICAL DATA: WBC 8100, hemoglobin 11.7, platelets 142, ANC 3900, creatinine 1.01, calcium 9.0.   Left humerus x-ray probable pathological fracture in the proximal third of the left  humeral shaft.   CT scan of the head without contrast shows subtle effacement of the sulci in the posterior right parietal region.   IMPRESSION AND RECOMMENDATIONS: This is a 64 year old female patient with history of multiple medical problems as described  above including stage IV metastatic breast cancer status post radiation therapy in the past for brain metastasis, more recently on home Hospice now admitted with confusion of 3 to 4 days duration along with fall and x-ray showing probable pathological fracture of the left humerus. The patient's general condition is extremely poor, unclear if she is even a surgical candidate. Awaiting Orthopedic evaluation at this time. In the meantime, will also get Radiation Oncology evaluation. If no surgical procedure is planned then could consider short course of palliative radiation to help with pain issues.   I have discussed above details with the patient's son, Arsenio Katz, over the phone, states that he wants to wait and discuss with Orthopedics and then make decision if they even want to pursue radiation therapy versus supportive care and continued Hospice treatment only. Have explained that we could try to control pain with medications and focus on comfort care if he decides on that option. The patient otherwise is DO NOT RESUSCITATE. Overall prognosis seems extremely poor. Oncology will continue to follow. Will request her primary oncologist, Dr. Inez Pilgrim, to continue follow-up and make further plan of management.   Thank you for the referral. Please feel free to contact me for additional questions.    ____________________________ Rhett Bannister Ma Hillock, MD srp:drc D: 06/29/2012 00:13:16 ET T: 06/29/2012 06:56:00 ET JOB#: [id number]  cc: Alphonsa Brickle R. Ma Hillock, MD, <Dictator> Alveta Heimlich MD ELECTRONICALLY SIGNED 06/29/2012 21:45

## 2015-02-12 NOTE — Consult Note (Signed)
PATIENT NAME:  Kara Flowers, Kara Flowers MR#:  [id number] DATE OF BIRTH:  10-13-1951  DATE OF CONSULTATION:  06/29/2012  REFERRING PHYSICIAN:  Dr. Ginette Pitman CONSULTING PHYSICIAN:  Aarti K. Nicolasa Ducking, MD  REASON FOR CONSULTATION: Altered mental status and capacity to make medical decisions.  IDENTIFYING INFORMATION: Mrs. Schwimmer is a 64 year old widowed African American female with multiple medical problems including stage IV breast carcinoma, currently living at home with home Hospice. Her son is one of her primary caretakers.   HISTORY OF PRESENT ILLNESS: Mrs. Illene Sweeting is a 64 year old widowed African American female with a history of multiple medical problems including hypertension, diabetes, and breast cancer with metastases who was admitted to the medicine floor with altered mental status after she was found down on the floor at her house. She was found to have an elevated troponin and CK, and a left arm x-ray showed pathological fracture.  CT scan of the head showed possible cerebrovascular accident. MRI of the head is pending, although the patient refused an MRI today. Psychiatry was consulted to assess for decision-making capacity. At the time of the interview the patient was lethargic and disoriented. She knew she was at [facility] and kept repeatedly saying she was on the second floor. When asked what the month was, she repeatedly said "second floor". When asked what the year was she said "second floor".  She was not answering questions appropriately and thought processes were loose. At one point she adamantly stated, "I do not want to talk to you".  At other times she was mumbling incoherently under her breath. Per the care manager, she refused to speak with the care manager earlier as well, and also refused the MRI of the head.  She told the techs not to touch her. Per the care manager, physical therapy has recommended short-term rehab. The patient was also seen by palliative care  and at that time the patient was irritable and refusing to answer questions with Dr. Ermalinda Memos. She has gotten several doses of morphine today. Per her son, who came to visit her in the hospital today, her mental status is not baseline. Apparently at  baseline her son says that she is fairly oriented and cooperative with care but has recently had reduced oral intake and weight loss. Per her son her mental status has changed and he believes that it is due to the morphine, as he said that she has never done well with that medication. The patient herself would not answer questions with regards to auditory or visual hallucination. She would not answer questions with regards to symptoms of depression or suicidal thoughts. Per her son she has been struggling with depression in the past and even sees a psychiatrist per the son. The son was unable to name who the psychiatrist is. Per her medication list she is on Topamax at 25 mg p.o. b.i.d. and trazodone 100 mg at bedtime but no antidepressants are listed.  Per prior records, the patient was hospitalized at [facility] back in 2009, and at that time was under the care of Dr. Octavia Heir. Per her son there is a history of polysubstance dependence but the patient has been clean from alcohol and drugs for several years. When she was discharged under the care of Dr. Octavia Heir in 2009, she was discharged with substance abuse treatment but no antidepressant. 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  PAST PSYCHIATRIC HISTORY:  As stated  in the history of present illness, there is a history of one prior inpatient psychiatric hospitalization at [facility] back in 2009 under the care of Dr. Octavia Heir.  Per her son she sees an outpatient psychiatrist although he did not know the name. She is not on any psychotropic medications other than trazodone currently.  It is unclear  what psychotropic medications for depression she has been on in the past but she is currently on trazodone.   SUBSTANCE ABUSE HISTORY: The patient is a recovering alcoholic and also has a history of polysubstance dependence per her son. Her son did not know exactly which drugs she has used in the past.    FAMILY PSYCHIATRIC HISTORY: The patient herself was unwilling to participate in the interview, although prior records indicate multiple family members with substance abuse problems.   PAST MEDICAL HISTORY:  1. Stage IV breast carcinoma with metastases to the brain status post radiation therapy. Hypertension.  2. Diabetes. 3. Gastroesophageal reflux disease.  4. Alcohol abuse. 5. Hepatitis B. 6. Hepatitis C. 7. Thrombocytopenia. 8. History of right mastectomy. 9. Partial hysterectomy.  10. Renal cell carcinoma status post right nephrectomy.   CURRENT MEDICATIONS IN THE HOSPITAL:  1. Flonase two sprays both nostrils daily.  2. Pancrelipase 12,000 units delayed release, 2 capsules p.o. t.i.d. 3. Pantoprazole 40 mg p.o. daily.  4. Spironolactone 25 mg p.o. b.i.d.  5. Trazodone 100 mg p.o. at bedtime.  6. Aspirin 325 mg p.o. daily.  7. Morphine 2 to 4 mg IV push q. 2 h. p.r.n.  8. Zofran p.r.n.   ALLERGIES: Codeine, penicillin, Percocet.   SOCIAL HISTORY: The patient was born and raised by her biological parents. She graduated high school. She is currently widowed and has one son. She has been staying at home with Hospice and has a lot of support from family members and especially her son.   LEGAL HISTORY: Unable to be obtained secondary to altered mental status.   MENTAL STATUS EXAM: Mrs. Tartt is a frail, 36- year-old [place] American female who is lying in her hospital bed. She was looking away from this writer and eye contact was poor. The patient was disoriented to situation and time. She knew she was at [facility] on the second floor, but could not give a reason as to why she was in  the hospital. She was answering questions inappropriately and thought processes were somewhat loose. She was not able to verbalize her mood. Affect was lethargic. The patient had just been given morphine earlier in the day. She has not verbalized any suicidal or homicidal thoughts and has not reported to any nurses that she is hallucinating, although the patient did appear to be responding to internal stimuli and appeared delirious. Attention and concentration were poor. Judgment and insight were poor. She was not able answer questions with regards to memory and recall.  SUICIDE RISK ASSESSMENT: At this time Mrs. Vanengen has not verbalized any suicidal thoughts, but due to altered mental status will remain at a low to moderate risk of harm to self and others secondary to delirium.   REVIEW OF SYSTEMS: The patient was unable to answer secondary to altered mental status.   PHYSICAL EXAMINATION:  VITAL SIGNS: Blood pressure 120/82, heart rate 80, respirations 20, temperature 97.2. Please see initial physical exam completed by Dr. Bridgett Larsson.  LABORATORY DATA:  Sodium 141, potassium 4.1, chloride 115, CO2 17, BUN 12, creatinine 0.85, glucose 93, white blood cell count 5.8, hemoglobin 12.3, platelet count  85. CK 655 at the time of admission. Ammonia level is pending.   DIAGNOSES:  AXIS I: Delirium, most likely secondary to recent pain medications as well as possible cerebrovascular accident. History of depressive disorder, recurrent. History of polysubstance dependence in full remission.   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   AXIS III:  Stage IV breast carcinoma with mets to the brain, hypertension, diabetes, gastroesophageal reflux disease, hepatitis B and C, and thrombocytopenia.   AXIS IV: Severe. Inability to care for self independently, chronic multiple medical conditions.   AXIS V: GAF at present equals 15.   ASSESSMENT AND TREATMENT RECOMMENDATIONS: Mrs.  Zeimet is a 64 year old African American female with a history of multiple medical problems including stage IV breast carcinoma with history of mets to the brain who was admitted to the medicine service after being found down on the floor. CT of the head did show a possible cerebrovascular accident.  MRI of the head is currently pending. The patient refused to cooperate with an MRI. She is currently experiencing altered mental status that is different from her baseline per her son. Other contributing factors include the morphine that she was given earlier today as well as possible stroke. At this time the patient was not cooperative with the interview and I am unable to assess her complete mental status. She is not currently oriented to situation or time and is not answering questions appropriately. Therefore at this particular moment in time she does not have the capacity to make a medical decision with regards to going to rehab treatment versus home; however, mental status may improve in the next 2 to 3 days as medical issues resolve. We will continue to follow the patient and reassess on a daily basis. We will also consider adding a medication for depression as her son does report that she has been depressed and it does not appear that she is currently on any medications for depression. We will need to also try to obtain information from the patient herself about who may be her outpatient psychiatrist as her son is having a difficult time remembering the name. ____________________________ Steva Colder. Nicolasa Ducking, MD akk:bjt D: 06/29/2012 19:39:16 ET T: 06/30/2012 05:24:03 ET JOB#: [id number]  Chauncey Mann MD ELECTRONICALLY SIGNED 07/01/2012 6:57

## 2015-02-12 NOTE — H&P (Signed)
PATIENT NAME:  Kara Flowers, Kara Flowers MR#:  [id number] DATE OF BIRTH:  10/21/1951  DATE OF ADMISSION:  06/27/2012  PRIMARY CARE PHYSICIAN: Dr. Ginette Pitman  REFERRING PHYSICIAN: Dr. Ulice Brilliant  CHIEF COMPLAINT: Confusion 3 to 4 days.   HISTORY OF PRESENT ILLNESS: 64 year old African American female with a history of hypertension, diabetes, breast cancer with metastasis presented to ED with confusion for 3 to 4 days. Patient came from home in hospice care with DO NOT RESUSCITATE CODE STATUS. She was found on the floor at home today. She also has left upper arm or pain for three weeks. Patient is confused. She is only oriented to her name. Could not provide any information. She was found to have elevated troponin and CK. Left arm x-ray showed pathologic fracture and CAT scan of head showed possible cerebrovascular accident.   PAST MEDICAL HISTORY:  1. Hypertension. 2. Diabetes. 3. Breast cancer with brain metastasis. 4. Alcohol abuse.  5. Gastroesophageal reflux disease. 6. Thrombocytopenia. 7. Hepatitis B. 8. Hepatitis C.   PAST SURGICAL HISTORY:  1. Port-A-Cath placement. 2. Mastectomy on the right side.  3. Partial hysterectomy.  4. Renal carcinoma status post right nephrectomy.  FAMILY HISTORY: Patient is confused, unable to provide any information but according to previous document, father is healthy, mother died of pulmonary embolus, had CABG, diabetes, hypertension.   SOCIAL HISTORY: Patient smokes 1/2 pack a day. No alcohol abuse or drug abuse.   REVIEW OF SYSTEMS: Patient is confused, unable to obtain at this time.   ALLERGIES: Codeine, penicillin, Percocet.   HOME MEDICATIONS: Please refer to patient's home medication list.   PHYSICAL EXAMINATION:  VITAL SIGNS: Temperature 99.5, blood pressure 138/78, pulse 84, respirations 18, oxygen saturation 99% on room air.   GENERAL: Patient is confused but in no acute distress.   HEENT: Pupils are round, equal, reactive to light. No  discharge from ear or nose. Moist oral mucosa.    NECK: Supple. No JVD or carotid bruits. No lymphadenopathy. No thyromegaly.   CARDIOVASCULAR: S1, S2 regular rate, rhythm. No murmurs, gallops.   PULMONARY: Bilateral air entry. No wheezing, rales. No use of accessory muscles to breathe.   ABDOMEN: Soft. No distention. Bowel sounds present. No organomegaly.   EXTREMITIES: No edema, clubbing or cyanosis. No calf tenderness. Strong bilateral pedal pulses.   SKIN: No rash or jaundice.   NEUROLOGIC: Patient is confused. Alert and oriented x1. Also is uncooperative, unable to examine at this time.   LABORATORY, DIAGNOSTIC AND RADIOLOGICAL DATA: Humerus x-ray shows probably pathologic fracture within proximal third of left humeral shaft. Chest x-ray: No acute cardiopulmonary disease. Hip right: No definite fracture. Right shoulder x-ray: No acute bony abnormality. CAT scan of head showed there may be a tiny left basal ganglial lacunar infarct. CT of cervical spine without contrast showed no cervical spine bony abnormality. Urinalysis negative. WBC 7.1, hemoglobin 13.3, platelets 160, glucose 104, BUN 18, creatinine 1.07. Sodium 138, potassium 3.8, potassium 3.8, chloride 110, bicarbonate 19. Troponin 0.25, CK 1128, CK-MB 3.8. EKG shows sinus tachycardia at 103 beats per minute.   IMPRESSION:  1. Altered mental status possibly due to CVA or brain metastasis.  2. Possible cerebrovascular accident.  3. Possible brain metastasis.  4. Rhabdomyolysis.  5. Hypertension.  6. Diabetes.  7. History of thrombocytopenia. 8. Gastroesophageal reflux disease.  9. Hepatitis B. 10. Hepatitis C.   PLAN OF TREATMENT:  1. Patient will be admitted to telemetry floor. Will start aspirin 325 mg p.o. daily. Will get MRI of  brain to rule out cerebrovascular accident or brain metastasis and will get a carotid duplex.  2. We will follow up CK, troponin, lipid panel and start IV fluid for rhabdomyolysis. The elevated  troponin is possibly due to rhabdomyolysis.  3. We will keep n.p.o. except medications and we will get swallowing study.  4. For diabetes we will start sliding scale but hold Lantus due to n.p.o. status.  5. We will get a palliative care consult.   Discussed the patient's situation and plan of treatment with patient's son.   TIME SPENT: About 65 minutes.  ____________________________ Demetrios Loll, MD qc:cms D: 06/27/2012 16:24:16 ET T: 06/28/2012 05:43:33 ET JOB#: [id number]  cc: Demetrios Loll, MD, <Dictator> Tracie Harrier, MD Demetrios Loll MD ELECTRONICALLY SIGNED 06/29/2012 15:47

## 2015-02-12 NOTE — Consult Note (Signed)
Chief Complaint:   Subjective/Chief Complaint NO ACUTE COMPLAINTS, STATES NOT IN SO MUCH PAIN. NOT SOB   VITAL SIGNS/ANCILLARY NOTES: **Vital Signs.:   05-Sep-13 19:55   Temperature Temperature (F) 98.7   Celsius 37   Temperature Source Oral   Pulse Pulse 94   Respirations Respirations 20   Systolic BP Systolic BP 884   Diastolic BP (mmHg) Diastolic BP (mmHg) 80   Mean BP 94   Pulse Ox % Pulse Ox % 95   Pulse Ox Activity Level  At rest   Oxygen Delivery Room Air/ 21 %   Brief Assessment:   Respiratory no use of accessory muscles    Gastrointestinal details normal Soft  Nontender    Additional Physical Exam ALERT, INTERMITTENTLY CONFUSED, ANSWERED QUESTIONS BUT DIFFICULTY FOCUSING. RECOGNIZES ME AND FAMILY MEMBER. ABDOMEN NON TENDER, TRACE SYMMETRIC EDEMA, LEFT ARM IN IMMOBILIZER, NO GROSS FOCAL OR FACIAL WEAKNESS. HAS MUCOSITIS OF LOWER LIP.Marland KitchenTHIS HAS BEEN A RECURRING PROBLEM FOR YEARS   Lab Results: Routine Chem:  05-Sep-13 04:43    Glucose, Serum 89   BUN 12   Creatinine (comp) 0.85   Sodium, Serum 143   Potassium, Serum 4.1   Chloride, Serum  116   CO2, Serum  16   Calcium (Total), Serum 9.0   Anion Gap 11   Osmolality (calc) 284   eGFR (African American) >60   eGFR (Non-African American) >60 (eGFR values <38m/min/1.73 m2 may be an indication of chronic kidney disease (CKD). Calculated eGFR is useful in patients with stable renal function. The eGFR calculation will not be reliable in acutely ill patients when serum creatinine is changing rapidly. It is not useful in  patients on dialysis. The eGFR calculation may not be applicable to patients at the low and high extremes of body sizes, pregnant women, and vegetarians.)  Routine Hem:  05-Sep-13 04:43    WBC (CBC) 5.3   RBC (CBC) 4.34   Hemoglobin (CBC) 12.2   Hematocrit (CBC) 36.7   Platelet Count (CBC)  148   MCV 85   MCH 28.1   MCHC 33.2   RDW  14.8   Neutrophil % 44.2   Lymphocyte % 43.0   Monocyte %  8.9   Eosinophil % 2.9   Basophil % 1.0   Neutrophil # 2.3   Lymphocyte # 2.3   Monocyte # 0.5   Eosinophil # 0.2   Basophil # 0.1 (Result(s) reported on 30 Jun 2012 at 06:42AM.)   Assessment/Plan:  Assessment/Plan:   Assessment HORMONE NEGATIVE BREAST CANCER. STAGE 4. IN 08/2011 BRAIN METASTASIS, RADIATION GIVEN WITH GOOD RESULT. NEUROLOICALLY INTACT AFTERWARDS, ALSO NO OBVIOUS SYSTEMIC METASTATIC DISEASE INITIALLY. RECENTLY DECLINING, LOOSING WT, APPETITE DOWN, SOME PAIN, FOLLOWED BY HOSPICE. INCREASED CONFUSION INITIALLY THOUGHT MEDICATION RELATED, BUT LOOKS LIKE DUE TO NEUROLOGIC DISEASE. NOW POSSIBLE CVA, NO GROSS TUMOR OR EDEMA ON NON CONTRAST HEAD CT, PATHOLOGIC FX HUMERUS. SEE ALSO DR PCandelaria StagersCONSULT.....Marland KitchenAD NOT WANTED ANY OHER SYSTEMIC TFessenden11/2012, AND NOT A CANDIDATE FOR OR ANY CURRENT INDICATION FOR SYSTEMIC CHEMOTX. DISCUSSED WITH RADIATION ONCOLOGY. RECOMMEND PALLIATIVE TX TO HUMERUS FOR PAIN RELIEF 5TX STARTING 9/9. PATIENT HAS AGREED WHEN SHE HAS BEEN LUCID AND COOPERATIVE. DISCUSED WITH SON WHO AGREES. OTHERWISE PALLIATIVE TX. DO NOT NEED BRAIN MRI AT THIS POINT, IF ABLE TO OBTAIN LATER WOULD GIVE PROGNOSTIC INFORMATION, RE POSSIBLE DIFFUSE SMALL METASTASIS.IF DECLINES CAN REPEAT CT OR EMPIRICALLY ADD DECADRON. IF IMPROVED TO POINT OF INDEPENDANT AMBULATION WOULD CONSIDER ZMerriam.[name]  PALLIATIVE XRT TO HUMERUS BEGIN 9/9.  I REDUCED IV FLUIDS.  NO SYSTEMIC CHEMOTX.  IF DECLINES CONSIDER EMPIRIC DECADRON OR REPEAT NON CONTRAST CT TO R/O CEREBRAL EDEMA. HOSPICE CARE.   Electronic Signatures: Dallas Schimke (MD)  (Signed 05-Sep-13 21:47)  Authored: Chief Complaint, VITAL SIGNS/ANCILLARY NOTES, Brief Assessment, Lab Results, Assessment/Plan   Last Updated: 05-Sep-13 21:47 by Dallas Schimke (MD)

## 2015-02-17 NOTE — Consult Note (Signed)
PATIENT NAME:  Kara Flowers, Kara Flowers MR#:  [id number] DATE OF BIRTH:  May 01, 1951  DATE OF CONSULTATION:  11/12/2011  REFERRING PHYSICIAN:   CONSULTING PHYSICIAN:  Gonzella Lex, MD  IDENTIFYING INFORMATION, REASON FOR CONSULTATION: The patient is a 64 year old woman brought in to the hospital with abdominal pain. Consult is listed as being because of a positive MDMA drug screen.   HISTORY OF PRESENT ILLNESS: Information was obtained from the chart, from the patient, and from the patient's sister. I am not entirely clear what the nature of the consult is. The sister told me this morning that the patient had not been confused or having any kind of altered mental status recently. According to the sister, the reason that the patient was brought into the hospital was because of swelling in her legs. This was not what I see in the History and Physical which suggests that there was some concern about altered mental status. However, I am not getting that history from the family or from the patient herself. There also is some concern that a drug screen turned up MDMA positive. The patient herself is able to tell me where she is and tell me the date. She is able to answer some questions reasonably. She does not however participate or cooperate very much with my examination. She frequently falls asleep or perhaps pretends to fall asleep during the examination. She began crying histrionically a couple of times when I had asked her a question too many times in a row only to fall asleep immediately again afterwards. The patient denies that she has been drinking any alcohol for four years. She denies that she has been using any kind of drugs whatsoever. She denies that she uses any kind of over-the-counter cold medicine or decongestant at home. She says that she lives at home and that she feels she has been doing well taking care of herself. The patient was not agitated or aggressive when I was talking with her.   PAST  PSYCHIATRIC HISTORY: Most notable for the fact that she used to have an alcohol dependence problem. Several years ago she was in the hospital and evaluated by psychiatry. 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 According to the patient and her family she stopped drinking several years ago and has not had a drink in four years. I do not see any subsequent psychiatric history after that.   SOCIAL HISTORY: The patient evidently does live on her own. She has multiple family members including her father and her sister who live nearby and look in on her.   PAST MEDICAL HISTORY: The patient actually has breast cancer and has brain metastases. She has been having full brain radiation as recently as a month or two ago. Additionally, she has a history of diabetes, high blood pressure, gastric reflux, thrombocytopenia, and hepatitis B and C.   REVIEW OF SYSTEMS: The patient complains of feeling "sick". She will not clarify exactly what she means by that. When I asked her if she felt nauseated she said no. When I asked her if she was having pain she said no. She says that her mood feels fine. She does not have any other specific complaints. She tends to avoid answering most questions.   MENTAL STATUS EXAMINATION: The patient was awake and initially somewhat cooperative. She made eye contact and interacted in a somewhat appropriate way. She did not appear to be confused or delirious. She was able to answer correctly where she  was and tell me that she was sick. She was able to tell me the date. After that when I asked her more specific questions she became uncooperative. She told me that she had not had a drink in four years. When I changed the topic to asking other questions it took her several minutes before she could tell me anything except that she has not had a drink in four years. Her affect was a little bit labile. She had couple of very spontaneous crying spells. These resolved quickly.  She did not appear to be responding to internal stimuli. She denied suicidal or homicidal ideation was not behaving in an aggressive manner. She interacted appropriately with the nurse who was in the room.   ASSESSMENT: Right now the patient is not aggressive or agitated. I do not see any need to manage that. Based on my examination she does appear to have some level of dementia, although it is hard to tell how much of that may be uncooperativeness. She certainly has multiple reasons to have some dementia and confusion given that she has recently had full brain radiation, that she has been having recurrent episodes of very low blood sugar, and her current medical problems. She is also on Decadron because of her brain tumors. It appears to be consensus among the patient and her family that she has not been abusing alcohol. She denies abusing any drugs and denies any decongestants or other prescription drugs at home. Of course it is impossible to know exactly what was going on at home, but I do not have any direct evidence right now that she was abusing anything. The MDMA drug screen is a little hard to know what to make of it. Ecstasy is not an extremely popular drug in the area, as far as I know, and would not usually be the first drug of choice for an older and medically ill woman. The MDMA drug screen frequently cross-reacts with a lot of over-the-counter decongestants and other cough and cold remedies as well as a long list of other medications; however, I do not see anything on the list that is what she is taking right now. I checked and it does not seem like any of the new designer drugs tend to show up on a MDMA screen. I guess for now I do not know what to make of it and probably would not make much of it.   TREATMENT PLAN: I do not suggest any psychiatric treatment plan at this point. If the patient continues to be confused and appears demented, it should probably be considered by care management whether  her living situation at home is really safe by consulting with her and with her family. Otherwise right now she is not agitated, not depressed and does not need any further psychiatric intervention.   DIAGNOSIS PRINCIPLE AND PRIMARY:   AXIS I: Rule out dementia due to medical illness.   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   AXIS II: Deferred.   AXIS III: Multiple medical problems, see list above.   AXIS IV: Severe - acute and chronic stress from illness.   AXIS V: Functioning at time of evaluation 41.  ____________________________ Gonzella Lex, MD jtc:slb D: 11/12/2011 18:26:15 ET T: 11/13/2011 08:36:42 ET JOB#: [id number]  cc: Gonzella Lex, MD, <Dictator> Gonzella Lex MD ELECTRONICALLY SIGNED 11/13/2011 9:57

## 2015-02-17 NOTE — Discharge Summary (Signed)
PATIENT NAME:  Kara Flowers, Kara Flowers#:  [id number] DATE OF BIRTH:  Jun 04, 1951  DATE OF ADMISSION:  11/11/2011 DATE OF DISCHARGE:  11/14/2011  DIAGNOSES AT TIME OF DISCHARGE:  1. Acute pancreatitis.  2. Chronic liver disease with cirrhosis of the liver and ascites.  3. Type 2 diabetes.  4. Hypertension.  5. Gastroesophageal reflux disease.  6. Depression.  7. History of polysubstance abuse.  8. History of cancer breast with metastasis to brain, bone and liver.  9. Benign hypoglycemia.   CHIEF COMPLAINT: Abdominal pain.   HISTORY OF PRESENT ILLNESS: Kara Flowers is a 64 year old female who presented to the ER complaining of abdominal pain, confusion associated with altered mental status. Patient denies any nausea, vomiting, or fever. Patient's serum lipase was elevated at 1369 with evidence for lethargy and confusion.   PAST MEDICAL HISTORY:  1. Type 2 diabetes.  2. Breast cancer, status post treatment. 3. Hypertension.  4. Alcohol use.  5. History of supraventricular tachycardia. 6. Gastroesophageal reflux disease.  7. Thrombocytopenia. 8. Chronic renal insufficiency. 9. Hepatitis B. 10. Hepatitis C.  11. She has also had previous Port-A-Cath placement.  12. Right mastectomy. 13. Partial hysterectomy. 14. Renal cell carcinoma status post right nephrectomy.   PHYSICAL EXAMINATION: VITAL SIGNS: Temperature 99.7, heart rate 78, respirations 18, blood pressure 129/66, oxygen saturation 100% on room air. HEENT: Normocephalic, atraumatic. Pupils equal, reactive to light. NECK: Supple. HEART: S1, S2. LUNGS: Clear to auscultation. SKIN: No skin rash. ABDOMEN: Soft, distended. EXTREMITIES: 2+ edema. NEUROLOGIC: Nonfocal.   LABORATORY, DIAGNOSTIC AND RADIOLOGICAL DATA: EKG shows sinus rhythm, left atrial enlargement, possible septal infarct. Chest x-ray was normal. She had elevated liver enzymes with ALT of 190, AST of 107, total protein 5.2, albumin 1.9. Hemoglobin 12, WBC 4.5,  hematocrit 36, platelet count 62. CK 264, MB 3.8, troponin less than 0.04. Urine tox screen was positive for MDMA. Lipase was elevated at 1369. Repeat lipase was 383. Ammonia level is elevated at 86.   HOSPITAL COURSE: During her stay in the hospital patient continued to have episodes of confusion and her blood sugar was noted to be low. She received D50 on two different occasions. Blood sugars started to come up. She was also started on Lasix and spironolactone. Leg edema also resolved. She was seen in consultation by GI and also by psychiatry. Dr. Weber Cooks, psychiatrist, felt MDMA drug screen had a high rate of cross reactivity with multiple medications and OTC decongestants. Dr. Candace Cruise, gastroenterologist, felt that patient would benefit from pancreatic enzyme supplements when she was able to take solid food. She was seen by speech pathologist and had a swallowing evaluation done. Her diet was gradually advanced. Clinically patient felt better and she was discharged home in stable condition on the following medications.   DISCHARGE MEDICATIONS:  1. Levaquin 500 mg a day for one week.  2. Creon 72,000 units with meals.  3. Spironolactone 25 mg p.o. b.i.d.  4. Lantus insulin 70 units subcutaneous daily.  5. Zofran 4 mg p.r.n.  6. Trazodone 100 mg at bedtime. 7. Omeprazole 20 mg b.i.d.  8. Humalog insulin subcutaneous per sliding scale.  9. Metoprolol 50 mg b.i.d.  10. Magnesium 400 mg b.i.d.  11. Promethazine 25 mg q.8 p.r.n.   DISCHARGE INSTRUCTIONS: Patient was advised to go to rehab but family and patient declined. Home health nurse was arranged for her. She was advised to follow up in the clinic with me, Dr. Ginette Pitman, in 1 to 2 weeks' time and call if  there are any questions or concerns.  ____________________________ Tracie Harrier, MD vh:cms D: 11/14/2011 12:20:10 ET T: 11/14/2011 14:19:38 ET JOB#: [id number]  cc: Tracie Harrier, MD, <Dictator> Tracie Harrier MD ELECTRONICALLY SIGNED  12/10/2011 12:36

## 2015-02-17 NOTE — Consult Note (Signed)
See Dawn Harrison's notes. Pt more alert now. Pt with breast ca with diffuse mets. Little to offer other than low fat diet with panc enzyme supplementation if and when patient able to take solids.  Electronic Signatures: Verdie Shire (MD)  (Signed on 17-Jan-13 19:05)  Authored  Last Updated: 17-Jan-13 19:05 by Verdie Shire (MD)

## 2015-02-17 NOTE — H&P (Signed)
PATIENT NAME:  Kara Flowers, Kara Flowers MR#:  [id number] DATE OF BIRTH:  1951/09/04  DATE OF ADMISSION:  10/01/2011  HISTORY OF PRESENT ILLNESS:  Kara Flowers is a 64 year old patient who is admitted after evaluation here in the [facility]. Kara Flowers has a history of ER negative HER-2 positive breast cancer status post six cycles of chemotherapy and a year of Herceptin who recently had presented with multiple brain metastasis, some of which had hemorrhage and the patient was transferred to [facility] for neurosurgery evaluation. She was ultimately released and plans for therapy and follow-up are to continue chronic medications, use Decadron for cerebral edema, treat labile diabetes with titration of insulin, to administer outpatient radiation treatment to the brain, and then later readdress issues of systemic disease. The patient had a head CT also and restaging and had evidence of metastatic disease and mediastinal hilar lymph nodes and supraclavicular lymph nodes. Working diagnosis is diffusely metastatic breast cancer. The patient has a prior history of renal cancer as well. Potentially future plans for additional systemic therapy depending on the outcome of the treatment of intracranial disease. On this background, the patient demonstrated confusion, memory lapses, inability to monitor and manage her insulin and other medications, blood sugar consistently over 500. It was over 580 today in the clinic, so that she is admitted today for management of uncontrolled diabetes, treatment also of headache, and increasing doses of Decadron while continuing radiation treatment, which of course can continue as an outpatient when the patient is stabilized. The patient denied fever, chills, or sweats or focal weakness, no gait disturbance. No nausea, vomiting or diarrhea. Admits to polyuria and polydipsia. No hot or cold intolerance. No rash or bruising.   PHYSICAL EXAMINATION:  GENERAL: Alert and cooperative, but significant  memory lapses and intermittently confused and then reorients. Cranial nerves are intact. No gross focal weakness.   MOUTH: No thrush.   NECK: No mass or palpable adenopathy.   LUNGS: Clear with decreased air entry. No wheezing or rales.   HEART: Regular.   ABDOMEN: Nontender. No palpable mass or organomegaly.   EXTREMITIES: No edema.   LABORATORY DATA: Sodium is 129, glucose was 588, potassium 4.5, creatinine 1.39. Recent MRI with contrast at [facility] showed findings of six total brain lesions, three of them had some associated hemorrhage. A PET scan was also done that showed abnormal uptake with a suggestion of metastatic disease in supraclavicular and mediastinal hilar lymph nodes.   PAST MEDICAL HISTORY:  1. In addition to breast cancer as above, mastectomy, and port placement.  2. Diabetes. 3. Hypertension. 4. Pancreatic insufficiency due to prior alcohol use in the past.  5. History of supraventricular tachycardia.  6. Gastroesophageal reflux disease.  7. Chronic thrombocytopenia due to chronic hepatitis C. 8. Hysterectomy in the past.  9. Nephrectomy for kidney cancer in 2002.   ALLERGIES: Codeine, penicillin and Percocet.  MEDICATIONS: Most recent medications: 1. Metoprolol 50 mg twice a day. 2. Omeprazole 20 mg daily.  3. Lantus insulin 50 units subcutaneous every evening. 4. Tramadol 50 mg every six hours p.r.n. for pain.  5. Creon tablets for pancreatic insufficiency.   FAMILY HISTORY: Noncontributory.   SOCIAL HISTORY: Lives alone. No current alcohol. No current smoking,   PHYSICAL EXAMINATION:  GENERAL: Alert, cooperative, oriented x3, but intermittently confused. Significant memory lapses. No gross focal weakness. As already noted, not in acute distress, not toxic.   VITAL SIGNS: Stable, afebrile, otherwise as above.   IMPRESSION:  1. Underlying  diffuse multiple brain metastases, some with hemorrhage and vasogenic edema. Had recently  started on low-dose Decadron 4 mg daily which exacerbated already labile and poorly controlled diabetes. Memory lapses and confusion attribute to central nervous system disease require initially higher doses of Decadron and proceeding with radiation treatments.  2. Uncontrolled diabetes requires sliding scale insulin titration with long-acting insulin and also inpatient management for also intravenous fluids and for a patient who is incapable of managing her own care due to the confusion.   PLAN: 1. The patient is admitted.  2. Modest IV fluids.  3. Watching electrolytes.  4. Sliding-scale insulin.  5. Long-acting insulin.  6. Pain medicines p.r.n.  7. Continue Decadron. 8. Would start with 3 times a day. May have to moderate that back down to twice a day.  9. Daily radiation.  10. Consult endocrinology.  11. Consult managed care regarding disposition.  56. Met with the patient's son in the clinic today regarding the patient's management issues.   ____________________________ Simonne Come Inez Pilgrim, MD rgg:ap D: 10/01/2011 15:24:33 ET T: 10/01/2011 15:47:28 ET JOB#: [id number]  cc: Simonne Come. Inez Pilgrim, MD, <Dictator> Dallas Schimke MD ELECTRONICALLY SIGNED 11/02/2011 11:48

## 2015-02-17 NOTE — Consult Note (Signed)
Brief Consult Note: Diagnosis: Acute pancreatitis.  Known history of chronic pancreatitis.  History of breast cancer with metastic disease to bone, brain and liver.  According to her sister patient as advised to stop all of her medications as she is proceeding with palliative care.  Patient held her medications @ 10-14 days ago.  This was inclusive of pancreatic enzyme therapy.  History of Hepatitis C and B.  Hypertension and Diabetes Mellitus.  Currently being monitored for hypoglycemia.  Thrombocytopenia.   Consult note dictated.   Recommend further assessment or treatment.   Discussed with Attending MD.   Comments: Patient's presentation discussed with Dr. Verdie Shire.  Unfortunately at this time we are limited in what we can offer given her current medical state.  If her mental status and her alertness improves to where she is able to eat then recommend a low fat diet and the initiation of pancreatic enzyme therapy.  Would recommend Creon 72,000 units of lipase with meals and 48,000 units with snack.  Electronic Signatures: Payton Emerald (NP)  (Signed 17-Jan-13 17:07)  Authored: Brief Consult Note   Last Updated: 17-Jan-13 17:07 by Payton Emerald (NP)

## 2015-02-17 NOTE — Consult Note (Signed)
Brief Consult Note: Diagnosis: none, yet. Sounds like prob some degree of delirium due to her multiple medical problems.   Comments: Psychiatry: Came to see pt but she was off getting a test. I did get to speak to her sister and reviewed the chart. I'm not entirely clear what the consult is for. She has brain mets from her cancer, recently finished full brain radiation, has been running blood sugars very low and is on mult meds including decadron. And she has pancreatitis. So there are many obvious reasons for some altered mental status. Her sister tells me that altered mental status was NOT an issue prior to admission and not why they brought her to the hospital. Sister says pt is always "talking funny" when blood sugar is low. Sister says she feels sure pt isn't using alcohol or drugs but pt does live alone and has a history of SA in the past. The MDMA drugs screen has a high rate of cross-reactivity with multiple medications esp otc decongestants. I'll try to come back later to see her.  Electronic Signatures: Gonzella Lex (MD)  (Signed 17-Jan-13 09:52)  Authored: Brief Consult Note   Last Updated: 17-Jan-13 09:52 by Gonzella Lex (MD)

## 2015-02-17 NOTE — Discharge Summary (Signed)
PATIENT NAME:  Kara Flowers, Kara Flowers MR#:  [id number] DATE OF BIRTH:  06-May-1951  DATE OF ADMISSION:  10/01/2011 DATE OF DISCHARGE:  10/05/2011  FINAL DIAGNOSES: 1. Poorly controlled diabetes, uncontrolled at admission.  2. Headache due to brain metastasis.  3. Brain metastasis from underlying breast cancer.  4. Chronic hepatitis C with chronic thrombocytopenia.  5. Hypertension.   HISTORY AND PHYSICAL: As dictated on admission.   HOSPITAL COURSE: The patient was initially is admitted, given IV fluids and electrolytes adjusted, put on a sliding scale insulin and then long-acting insulin was adjusted. Endocrinology was consulted to adjust medications. Cranial radiation for brain metastasis. High dose Decadron for brain metastasis given. On 12/09, the patient continued to improve. Blood sugars were still elevated. Insulin Lantus doses were continually adjusted based on the sugars and their prior sliding scale. Mental status was better, but still had intermittent agitation. Headache was better. We were subsequently able to discontinue intravenous fluids, switched all p.o. medications and family meeting and care planning evaluations were arranged so that the patient would be discharged with home services to live in her son's house. On the day of discharge, the patient was stable, still some intermittent agitation. No acute issues.   DISCHARGE PLANS:  1. Radiation/oncology appointment for 10/06/2011.  2. Endocrinology follow-up one with Dr. Gabriel Carina on 12/18.  3. Family and nursing to monitor the patient's medications.   DISCHARGE MEDICATIONS: 1. Omeprazole 20 mg 1 tablet twice a day. 2. MiraLAX 17 grams once a day.  3. Magnesium oxide 400 mg twice a day. 4. Metoprolol 50 mg twice a day. 5. Humalog quick pen sliding scale according to the following schedule: 3 units if blood sugar is 150 to 200, 6 units if 201 to 250, 9 units if 251 to 300, 12 units if 301 to 350, 15 units for higher than 351.   6. Phenergan 25 mg every eight hours p.r.n.  7. Creon 2 capsules orally with meals.  8. Trazodone 100 mg every evening. 9. Decadron 4 mg b.i.d.  10. Zofran 4 mg every 12 hours p.o. p.r.n. for nausea.  11. Lantus insulin 70 mg subcutaneous every evening. 12. Humalog insulin 30 units before meals 3 times a day in addition to the sliding scale.   ____________________________ Simonne Come. Inez Pilgrim, MD rgg:ap D: 11/04/2011 16:27:39 ET T: 11/05/2011 08:28:43 ET JOB#: [id number]  cc: Simonne Come. Inez Pilgrim, MD, <Dictator> Dallas Schimke MD ELECTRONICALLY SIGNED 11/05/2011 8:57

## 2015-02-17 NOTE — Consult Note (Signed)
PATIENT NAME:  Kara Flowers, Kara Flowers MR#:  [id number] DATE OF BIRTH:  Jul 12, 1951  DATE OF CONSULTATION:  11/12/2011  REFERRING PHYSICIAN:  Dr. Ginette Pitman CONSULTING PHYSICIAN:  Dr. Eddie Dibbles Oh/ Payton Emerald, NP  PRIMARY CARE PHYSICIAN:  Dr. Ginette Pitman  REASON FOR CONSULTATION: Pancreatitis, liver disease.   HISTORY OF PRESENT ILLNESS: Kara Flowers is a 64 year old African American female with a significant past medical history of diabetes, breast cancer with metastatic involvement now to brain, liver, as well as bone. This was evident as well on PET scan. According to sister who is present during interviewing process, chemotherapy is being stopped as well as radiation therapy. Radiation therapy was stopped approximately 10 to 14 days ago. The patient is denying Hospice care. She wishes to go home with her family. The patient's sister does state though that when she was told that there was nothing further to be offered to her from an oncology standpoint, that she was told to stop all of her medications. The patient has done that and thus the patient has been off of her Creon since being told that. She presented because of sudden swelling about a week ago to her abdomen, face, and legs. Legs have been very painful. She has had no nausea, no vomiting according to her sister, but has had neurological changes over the past 24 to 48 hours. During her hospitalization she has been hypoglycemic. Lipase was noted to be elevated at 1369. Unable to obtain further history from the patient as she is extremely groggy. She is arousable with touch but falls immediately back to sleep.  Unable to obtain a specific details which led to hospitalization from patient.   PAST MEDICAL HISTORY:  1. Diabetes.  2. Breast cancer status post treatment, radiation and chemotherapy. 3. Hypertension.  4. History of alcohol abuse, has been abstinent for the past four years. 5. Recreational drug use. Has abstained for the past four years.   6. History of supraventricular tachycardia.  7. Reflux.  8. Thrombocytopenia. 9. Chronic renal insufficiency. 10. Hepatitis B. 11. Hepatitis C. 12. Chronic pancreatitis.   PAST SURGICAL HISTORY:  1. Port-A-Cath placement.  2. Mastectomy on the right.  3. Partial hysterectomy. 4. Renal cell carcinoma status post right nephrectomy.   ALLERGIES: Codeine, penicillin, Percocet.   CURRENT MEDICATIONS which the patient should be taking: 1. Creon 12,000 units 2 capsules with meals.  2. Decadron 4 mg, 1 twice a day.  3. Humalog sliding scale. 4. Humalog 30 units subcutaneous 4 times daily before meals. 5. Lantus 70 units nightly.  6. Magnesium oxide 400 mg twice a day.  7. Metoprolol 50 mg twice a day. 8. MiraLAX.  9. Omeprazole 20 mg twice a day as needed.  10. Promethazine 25 mg, 1 every eight hours as needed.  11. Trazodone 100 mg at night. 12. Zofran 4 mg as directed as needed.   SOCIAL HISTORY: Smokes a pack of cigarettes a day. History of heavy alcohol use, has abstained for the past four years. History of recreational drug use, used to smoke cocaine as well as IV cocaine use.  No marijuana use. Onset of recreational drug use at the age of 70. High risk behavior history reviewed. Incarcerated for one year at approximate age of 58. No history of tattoos. No blood transfusion. No Armed forces logistics/support/administrative officer. Close intimate contact with partner with HIV. High risk behavior history is obtained from sister.   REVIEW OF SYSTEMS: Unable to obtain due to the patient's current neurological state.   PHYSICAL  EXAMINATION:  VITAL SIGNS: Temperature 101.1, pulse 84, respirations 18, blood pressure 121/79, pulse oximetry 97%.   GENERAL: Well-developed, well nourished 64 year old African American female. No acute distress noted but very sleepy, groggy. Able to arouse with palpation to abdomen, but very easily falls back to sleep.   HEENT: Pupils equal, reactive to light. Conjunctivae clear. Sclerae  anicteric.   NECK: Supple. Trachea midline. No lymphadenopathy or thyromegaly.   PULMONARY: Anteriorly symmetric rise and fall of chest. No adventitious sounds.   CARDIOVASCULAR: Regular rate and rhythm, S1 and S2.   ABDOMEN: Large soft, nondistended. The patient grimaces and arouses with palpation to left side of abdomen, evident of splenomegaly, questionable hepatomegaly, otherwise no masses. Bladder nondistended.   RECTAL: Deferred.   MUSCULOSKELETAL: No contractures or clubbing.   PSYCH: Unable to adequately assess. Alert with deep palpation.   NEUROLOGIC: Unable to assess due to sedative state as the patient has received pain medication therapy.   LABORATORY, DIAGNOSTIC, AND RADIOLOGICAL DATA: Chemistry panel 01/16: Glucose 149, BUN 31, B-type natriuretic peptide was 817, calcium level was low at 8.1. LDH was 525, lipase, again, 1369. Hepatic panel: Total protein 5, albumin 1.8, AST 137 and ALT 176. Today comparison glucose was 47, BUN 19, calcium low at 8.1, total cholesterol 272, triglycerides elevated at 320, HDL is 90, amylase elevated at 253, lipase improved to 383. Hemoglobin A1c is 11.2. Ammonia level elevated at 86. CK total 264. CK-MB 3.8; second in series, CK total is 245, CK-MB is 3.5, and troponin is 0.04.  Today hemoglobin 11.7 with hematocrit 36, platelet count is 40,000. Yesterday it was 53,000 with hemoglobin of 11.8. Urine drug screen revealed positive for MDMA. Urinalysis revealed +2 blood, 100 mg/dL. EKG is sinus rhythm with a short PR interval. Echocardiogram revealed left ventricle is grossly normal in size. There is no thrombus. The left ventricular systolic function is normal. Ejection fraction is greater than 55%. There is normal left ventricular wall thickness.  The left ventricular wall motion is normal and right ventricular systolic function is normal. CT of the head without contrast reveals involutional changes without evidence of acute abnormalities, chronic  changes. Chest x-ray, lateral: Normal-appearing cardiac silhouette.  Lungs are clear.  Abdominal ultrasound revealed limited visualization of the pancreas, inferior vena cava and aorta without gross abnormalities. Small amount of ascites, borderline hepatomegaly. Nonvisualization of kidney as in correlation with her history. Ultrasound Doppler bilateral lower extremities: No sonographic evidence of deep venous thrombus.   IMPRESSION:  Known history of chronic pancreatitis, presentation of acute pancreatitis. Possible correlation with the fact that the patient stopped pancreatic enzyme therapy 10 to 14 days ago. Concern also existing with known history of breast cancer as well as renal cell carcinoma and evidence of metastatic involvement now involving brain, bone, and liver. Known history of hepatitis B, hepatitis C, thrombocytopenia, history of polysubstance abuse.   PLAN: The patient's presentation was discussed with Dr. Verdie Shire. At this time, unfortunately, given the patient's current mental status very limited in what to offer to help and assist in the patient's care.  If the patient should become more awake and able to tolerate diet, would recommend low fat diet as well as the initiation of Creon therapy at that time, to be 72,000 units with meals and 48,000 units with snacks. In view of ammonia level being elevated, possible component of  hepatic encephalopathy given her history of hepatic disease. Would recommend the consideration of lactulose enema which may possibly help in improvement of neurological  status.  Thank you for allowing [place] to participate in her care during her hospitalization.   These services provided by Ebony Cargo, NP in collaborative agreement with Dr. Verdie Shire.   ____________________________ Payton Emerald, NP dsh:bjt D: 11/12/2011 17:24:23 ET T: 11/13/2011 07:42:35 ET JOB#: [id number]  cc: Payton Emerald, NP, <Dictator> Payton Emerald MD ELECTRONICALLY SIGNED 11/13/2011  17:30

## 2015-02-17 NOTE — H&P (Signed)
PATIENT NAME:  Kara Flowers, Kara Flowers MR#:  [id number] DATE OF BIRTH:  03/15/1951  DATE OF ADMISSION:  11/11/2011  PRIMARY CARE PHYSICIAN:  Dr. Ginette Pitman ER PHYSICIAN:  Dr. Owens Shark  CHIEF COMPLAINT: Abdominal pain.  HISTORY OF PRESENT ILLNESS:  The patient is a 64 year old female who presents with chief complaint of abdominal pain, confusion, and altered mental status. She reported abdominal pain. She denies nausea, vomiting, or fevers. Her lipase was noted to be elevated at 1369. patient is lethargic,confused she is unable to provide much information in regards to what exactly happened.  PAST MEDICAL HISTORY:  1. Diabetes.  2. Breast cancer status post treatment.  3. Hypertension.  4. Alcohol abuse.  5. History of supraventricular tachycardia.  6. Gastroesophageal reflux disease. 7. Thrombocytopenia. 8. chronic  insufficiency. 9. Hepatitis B.  10. Hepatitis C.   PAST SURGICAL HISTORY:  1. Port-A-Cath placement. 2. Mastectomy on the right.  3. Partial hysterectomy. 4. Renal cell carcinoma status post right nephrectomy.   ALLERGIES: Codeine, Percocet, penicillin.   CURRENT MEDICATIONS:  1. Creon 12,000 units, 2 capsules with meal.  2. Decadron 4 mg one p.o. b.i.d. 3. Humalog sliding scale.  4. Humalog 30 units subcutaneously 3 times daily before meal.  5. Lantus 70 units nightly.  6. Magnesium oxide 400 mg p.o. b.i.d.  7. Metoprolol 50 mg p.o. b.i.d.  8. MiraLAX.  9. Omeprazole 20 mg p.o. b.i.d. p.r.n.  10. Promethazine 25 mg 1 p.o. q. 8 hours p.r.n.  11. Trazodone 100 mg p.o. nightly. 12. Zofran 4 mg p.o. p.r.n.   SOCIAL HISTORY: The patient smokes a half pack per day. Denies alcohol abuse or drug abuse. The patient used to work as a [employer]ar[employer] manager.   FAMILY HISTORY: Father is 71, is healthy. Mother died of pulmonary embolism, had coronary artery bypass graft, diabetes, and hypertension.   REVIEW OF SYSTEMS: CONSTITUTIONAL: The patient denies any fevers, chills, or night sweats.  HEENT: The  patient denies any hearing loss, dysphagia, visual problems, or sore throat. CARDIOVASCULAR:  The patient denies any orthopnea, PND, or syncope. RESPIRATORY: The patient denies any cough, wheezing, or hemoptysis. GI: The patient denies any hematemesis, hematochezia, or melena. GU: The patient denies any hematuria, dysuria, or frequency. NEURO: The patient denies any headache, focal weakness, or seizures. SKIN: The patient denies any lesions or rash. ENDOCRINE:  The patient denies polyuria, polyphagia, or polydipsia. MUSCULOSKELETAL: The patient denies any arthritis, joint effusion, or swelling. HEMATOLOGICAL: The patient denies any easy bleeding or bruises.   PHYSICAL EXAMINATION:  VITAL SIGNS: Temperature 99.7, heart rate 78, respiratory rate 18, blood pressure 129/66, oxygen saturation 100%.   HEENT: Atraumatic, normocephalic. Pupils equal, round, reactive to light and accommodation.  Extraocular movements intact. Sclerae anicteric. Mucous membranes moist.   NECK: Supple. No organomegaly.   CARDIOVASCULAR: S1, S2. Regular rate and rhythm. No gallops. No thrills. No murmurs.   RESPIRATORY: Lungs are clear to auscultation. No rales, no rhonchi, no wheezes, no bronchial breath sounds.   GI: Abdomen is soft, nontender, and nondistended. Normal bowel sounds. No hepatosplenomegaly.   GU: There is no hematuria or masses.   SKIN: No lesions or rash.   ENDOCRINE: No masses, no thyromegaly.   LYMPH: No lymphadenopathy or nodes palpable.   NEUROLOGIC: Cranial nerves II through XII grossly intact. Motor strength is 5/5 in bilateral upper and lower extremities. Sensation within normal limits. No focal neurological deficit noted on examination.   MUSCULOSKELETAL: No arthritis, joint effusion, or swelling.   HEMATOLOGIC: No ecchymosis, no  bleeding, and no petechiae noted.   LABORATORY, DIAGNOSTIC, AND RADIOLOGICAL DATA:  Lower extremity ultrasound is negative. Chest x-ray is normal.    Electrocardiogram:  Sinus rhythm, short PR interval, possible left atrial enlargement, 88 beats per minute, septal infarct.   ASSESSMENT AND PLAN:  1. The patient is a 64 year old female who presents with chief complaint of altered mental status. CT scan of the brain is negative. The patient toxicology screen is positive for MDMA which could be contributing to her altered mental status.  Right now she is awake, she is able to communicate. She is somewhat lethargic. We will monitor her mental status closely.  2. Acute pancreatitis, dehydration. Keep the patient n.p.o. Start IV fluids. Dilaudid for pain control.  3. Hypoalbuminemia due to chronic pancreatitis. Monitor closely. Dietary consultation.  4. Mild thrombocytopenia due to chronic alcohol abuse. The patient has been abstinent. Monitor platelet count closely. 5. Elevated troponin. Check serial cardiac enzymes, troponin.  6. Diabetes. Accu-Cheks, insulin sliding scale. Continue premeal insulin and Lantus.  7. Hypertension. Continue metoprolol. 8. Gastroesophageal reflux disease. Continue PPI.  9. Depression. Continue trazodone.    ____________________________ Tyrone Schimke, MD jsp:bjt D: 11/11/2011 06:42:02 ET T: 11/11/2011 07:11:21 ET JOB#: [id number]  cc: Tyrone Schimke, MD, <Dictator> Tracie Harrier, MD Tyrone Schimke MD ELECTRONICALLY SIGNED 11/11/2011 21:57

## 2015-02-17 NOTE — Consult Note (Signed)
PATIENT NAME:  Kara Flowers, Kara Flowers MR#:  [id number] DATE OF BIRTH:  [birth year]  DATE OF CONSULTATION:  09/22/2011  REFERRING PHYSICIAN:   CONSULTING PHYSICIAN:  Simonne Come. Garnell Begeman, MD  HISTORY OF PRESENT ILLNESS: Ms. Kara Flowers is a 64 year old patient well known to me, last seen October 22nd, with a history of breast cancer that was node positive, ER negative, HER-2 positive disease, treated with Cytoxan, Adriamycin, and Taxotere/Cytoxan, and also carboplatin therapy plus her Ceftin and then completed a year of Herceptin. The patient has history that includes diabetes and medical illnesses as elucidated below, and she was hospitalized because of persistent high glucose and also with some confusion and memory lapses. She was identified to have hyperglycemia and started on intravenous fluids and given hydration for mild chronic kidney disease. Blood pressure is treated and the patient has a mild chronic thrombocytopenia due to chronic Hepatitis C. In addition, however, a noncontrast head CT showed two areas compatible with metastatic disease with vasogenic edema although, according to Radiology, bleed in one or both sides could not be ruled out and further imaging with MRI is pending. Oncology evaluation is called to see the patient with breast cancer history and apparently new metastatic disease in the brain. She currently has no headache, dizziness, chills, sweats. She is comfortable, although is nervous and understands that cancer in the brain is a serious relapse and life-threatening condition. She does not have cough or wheezing. No retrosternal chest pain. No abdominal pain. No palpitations. She does have some discomfort waxing and waning in the left breast. This is an old finding. No vomiting. No diarrhea. No increased edema. No focal weakness. She has not had dysuria or hematuria. She has not had any other GI symptoms or rectal bleeding. She complained on admission of some leg pain; not currently complaining  of pain. No rashes or bruising.   PAST MEDICAL HISTORY:  1. History of breast cancer status post prior therapies as noted. 2. Diabetes. 3. Hypertension. 4. Alcohol abuse in the past.  5. Chronic pancreatic insufficiency. 6. History of SVT. 7. Gastroesophageal reflux disease. 8. Chronic thrombocytopenia. 9. Hepatitis B infection in the past. 10. Chronic Hepatitis C. 11. Port-A-Cath placement. 12. Mastectomy. 13. Prior partial hysterectomy.  14. Nephrectomy for kidney cancer.   ALLERGIES: Codeine, Percocet, and penicillin.   MEDICATIONS AT THE TIME OF ADMISSION:  1. Zyrtec 10 mg daily. 2. Sliding scale Humalog. 3. Ibuprofen p.r.n.  4. Lantus 15 units at bedtime.  5. Magnesium oxide 400 mg twice a day. 6. Metoprolol 50 mg twice a day.  7. Omeprazole 20 mg daily.  8. Ropinirole 0.5 mg at night. 9. Trazodone 50 mg at bedtime. 10. Vitamin B daily.  11. Senokot p.r.n.  12. Hyoscyamine p.r.n.  13. Creon tablets 2 t.i.d.   SOCIAL HISTORY: No alcohol for years; prior alcohol abuse. No drug use in the past. Admits to smoking cigarettes.   FAMILY HISTORY: Noncontributory for malignancy.   PHYSICAL EXAMINATION:   GENERAL: The patient is alert, cooperative, anxious, was not in acute distress.   HEENT: Sclerae no jaundice.   MOUTH: No thrush.   NECK: No mass.   LYMPH: No palpable lymph nodes in the neck, supraclavicular, submandibular, or axilla.   LUNGS: Clear. Decreased air entry. No wheezing or rales.   HEART: Regular.   ABDOMEN: Slightly protuberant, slightly tympanitic, nontender. No palpable mass or organomegaly.   EXTREMITIES: No extremity edema.   NEUROLOGIC: Grossly nonfocal, alert and cooperative. I did not test the gait. The  patient is in bed.   LABORATORY, DIAGNOSTIC, AND RADIOLOGICAL DATA: Labs on admission showed sugar over 500, creatinine 1.3, calcium 9.2. Liver function tests elevated AST. Hemoglobin 13, white count 4.9, platelets 117. A noncontrast head  CT reported two abnormal areas with some changes possibly compatible with, but less likely, representing bleeding and also some vasogenic edema.   IMPRESSION: The patient is with history of poor prognosis of breast cancer, ER negative disease, node positive disease, now with findings in the brain that are most likely metastatic disease. No other signs or symptoms currently but she has not been restaged for systemic relapse. Note also the patient had a nephrectomy in 2003 so potentially brain metastasis could be, although less likely, would be due to renal cancer. The patient has poorly controlled diabetes and the Decadron given for CNS disease is going to exacerbate that. Other conditions including chronic low platelets are at baseline.   PLAN: MRI is planned and Radiology is going to evaluate if any evidence of bleed. If bleeding cannot be ruled out, Neurosurgery opinion and transfer to a [facility] would be planned. Otherwise, would continue steroids and change to p.o. Decadron at 8 mg b.i.d. and consult Radiation/Oncology in the morning. When sugar is controlled, go ahead and look at a PET scan for restaging.   ____________________________ Simonne Come. Inez Pilgrim, MD rgg:drc D: 09/22/2011 85:46:27 ET (Entered as wrong work type, 09)  T: 09/23/2011 12:47:40 ET    JOB#: [id number]  cc: Simonne Come. Inez Pilgrim, MD, <Dictator> [place] Schimke MD ELECTRONICALLY SIGNED 11/02/2011 11:45
